# Patient Record
Sex: Female | Born: 1990 | Race: White | Hispanic: No | Marital: Single
Health system: Southern US, Community
[De-identification: ages and names within clinical notes are randomized; demographics above are authoritative.]

## PROBLEM LIST (undated history)

## (undated) DIAGNOSIS — Z87442 Personal history of urinary calculi: Secondary | ICD-10-CM

## (undated) DIAGNOSIS — T7840XA Allergy, unspecified, initial encounter: Secondary | ICD-10-CM

## (undated) DIAGNOSIS — M940 Chondrocostal junction syndrome [Tietze]: Secondary | ICD-10-CM

## (undated) HISTORY — DX: Allergy, unspecified, initial encounter: T78.40XA

## (undated) HISTORY — PX: CARPAL TUNNEL RELEASE: SHX101

## (undated) HISTORY — PX: ROTATOR CUFF REPAIR: SHX139

## (undated) HISTORY — PX: WISDOM TOOTH EXTRACTION: SHX21

## (undated) HISTORY — PX: KNEE SURGERY: SHX244

## (undated) HISTORY — PX: CHOLECYSTECTOMY: SHX55

## (undated) HISTORY — PX: APPENDECTOMY: SHX54

## (undated) HISTORY — DX: Chondrocostal junction syndrome (tietze): M94.0

---
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
## 2008-11-23 IMAGING — CT CT ANGIO CHEST
2 of 4 series · 19 of 35 positions shown · IV contrast ([ID] OMNI 300)
Comparison: 03/12/2007 plain film.

CLINICAL DATA: Right-sided chest pain and pleuritic pain. Question pulmonary
embolism. Dyspnea starting 2 days ago status post upper cholecystectomy. Smoker.

CT ANGIOGRAPHY OF CHEST - PULMONARY EMBOLISM PROTOCOL
TECHNIQUE: Multidetector CT imaging of the chest was performed according to the
protocol for detection of pulmonary embolism during bolus injection of
intravenous contrast.  Coronal and sagittal plane CT angiographic image
reconstructions were also generated.
Contrast:  100 cc Omnipaque 300

[Series 6: thin recons · axial · 0.55mm/px · z∈[-230,-30]mm · 17 of 218 slices shown]
[im 9/218  lung]
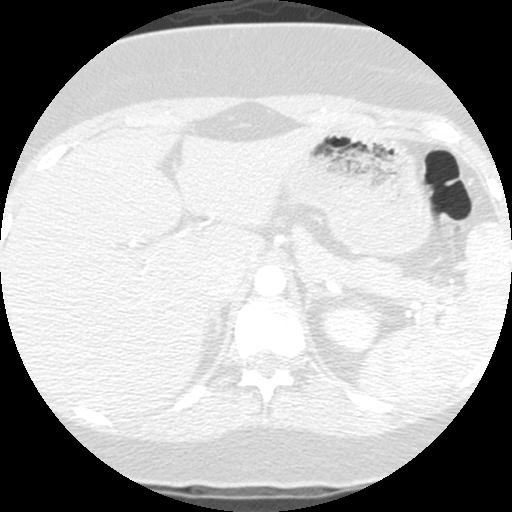
[im 26/218  mediastinal]
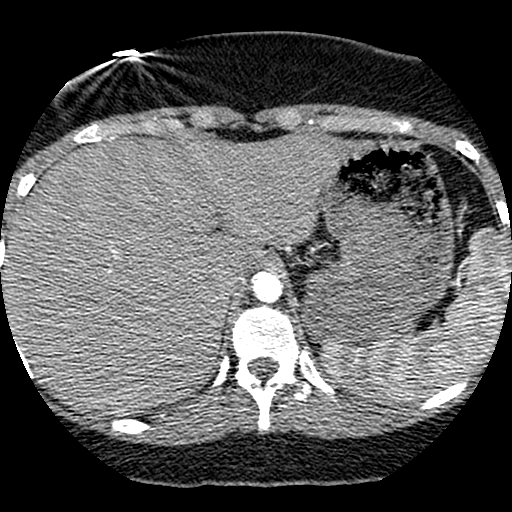
[im 34/218  lung]
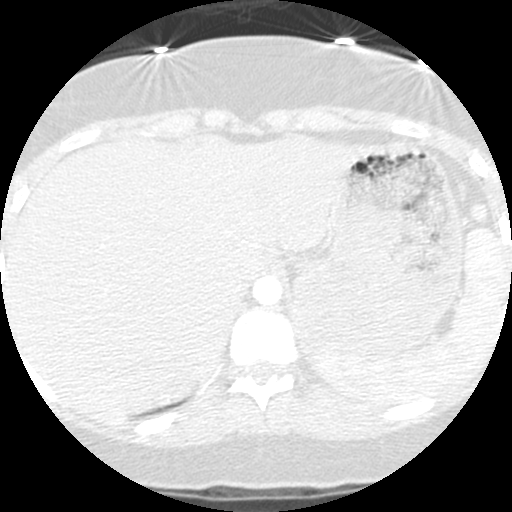
[im 51/218  mediastinal]
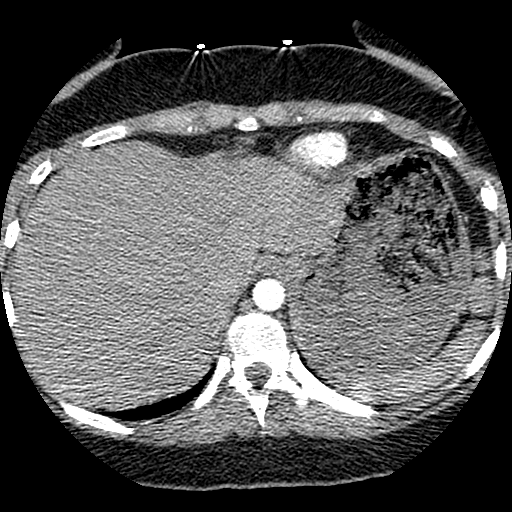
[im 59/218  lung]
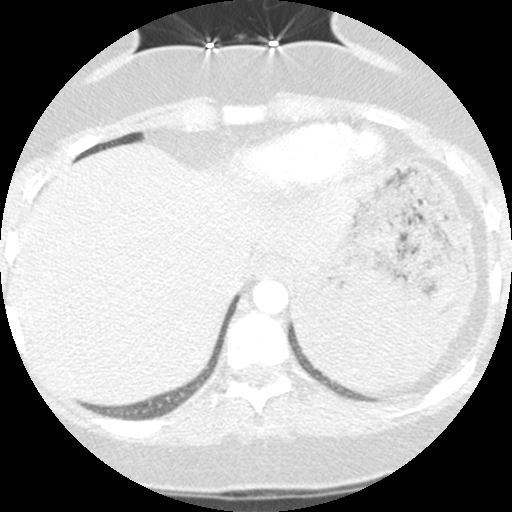
[im 76/218  mediastinal]
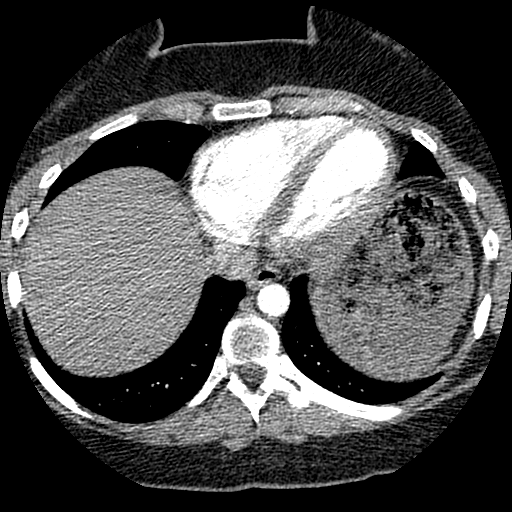
[im 84/218  lung]
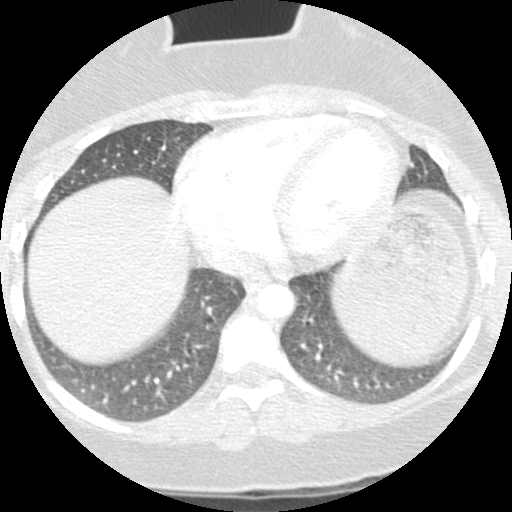
[im 101/218  mediastinal]
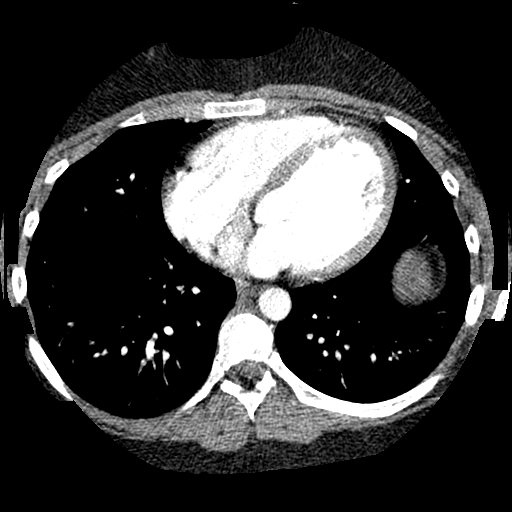
[im 117/218  lung]
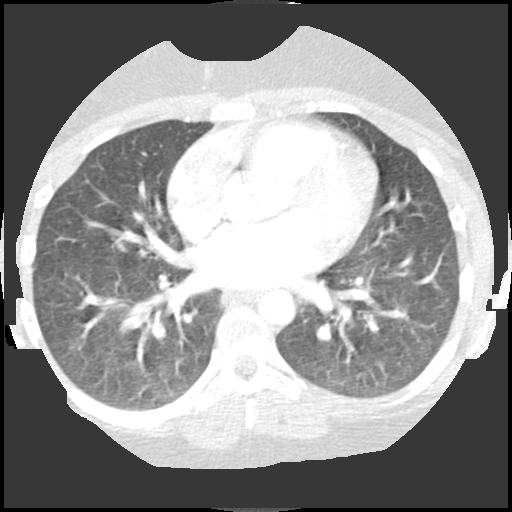
[im 124/218  mediastinal]
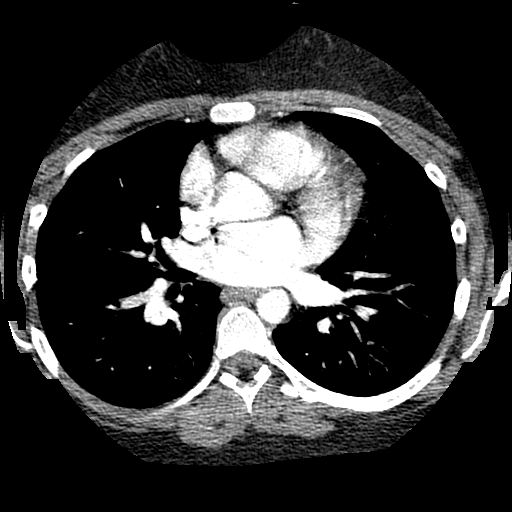
[im 134/218  lung]
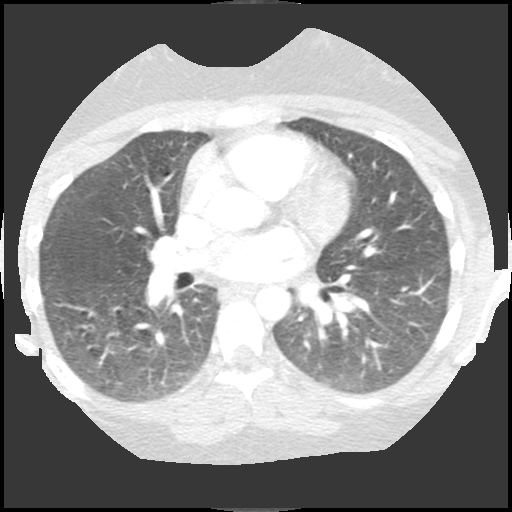
[im 142/218  mediastinal]
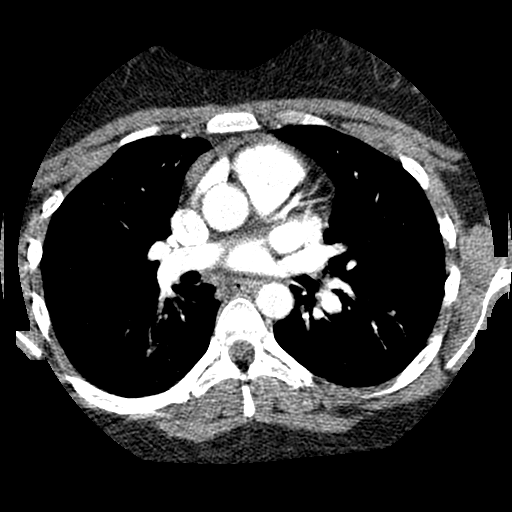
[im 159/218  lung]
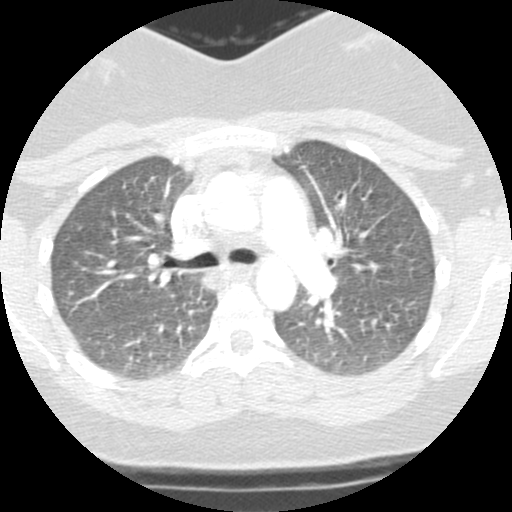
[im 167/218  mediastinal]
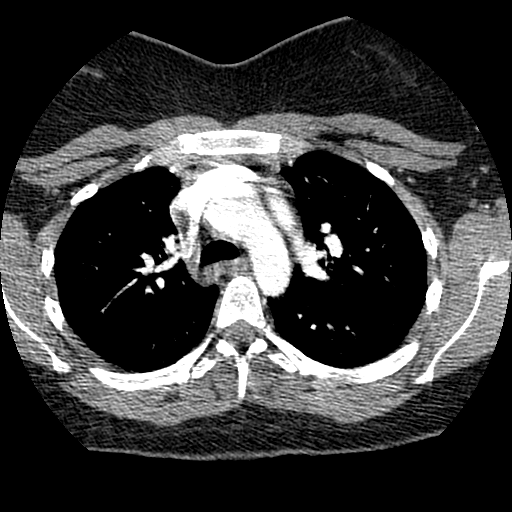
[im 184/218  lung]
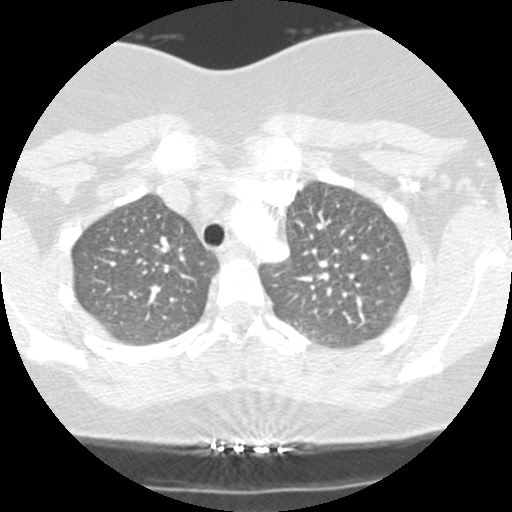
[im 192/218  mediastinal]
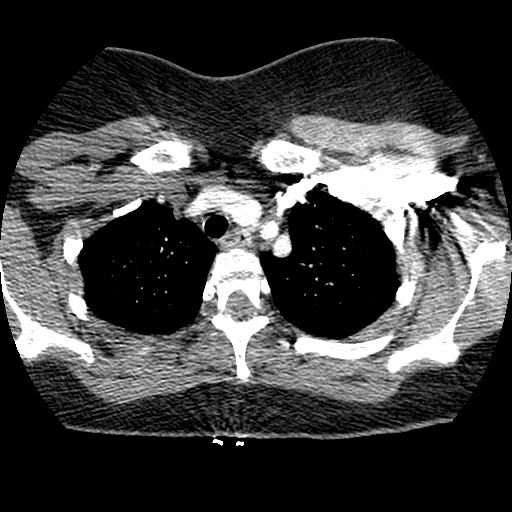
[im 209/218  lung]
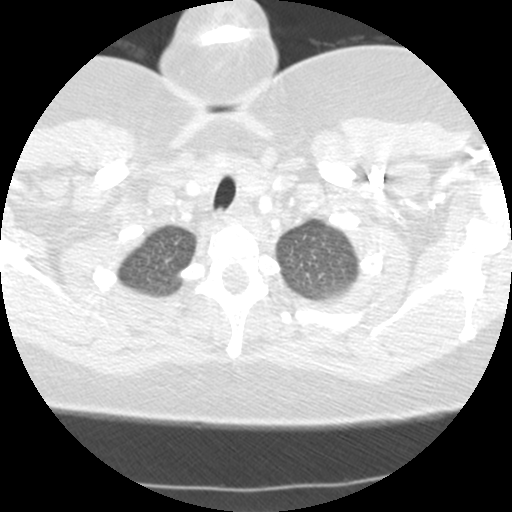

[Series 601: sag chest · sagittal · 0.55mm/px · 2 of 127 slices shown]
[im 43/127  mediastinal]
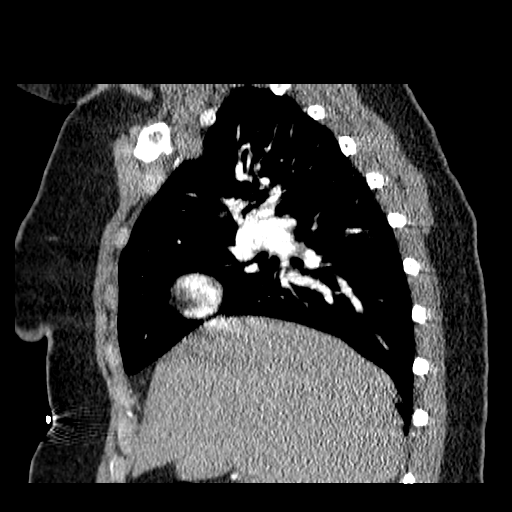
[im 85/127  mediastinal]
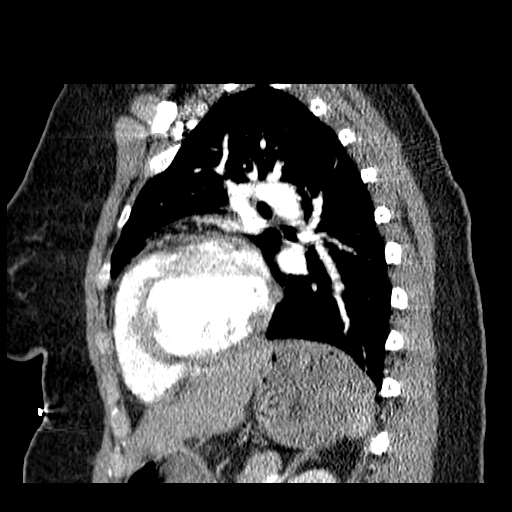

[19 of 35 positions shown; findings below may reference images not displayed]

FINDINGS: Lung windows demonstrate moderate limitation due to patient's size.
Increased density throughout both lungs likely related to poor inspiratory
effort and patient size. No nodules. No airspace opacities. Mild motion artifact
involves the lung bases.
Soft tissue windows:  The quality of this exam for evaluation of pulmonary
embolism is poor-moderate. The primary limitations are patient's size and motion
artifact. No central pulmonary embolism. No lobar pulmonary embolism. No large
segmental embolism to the lower lobes. .

Mild cardiomegaly without pericardial or pleural effusion. No mediastinal or
hilar adenopathy. Anterior mediastinal soft tissue on image 26 of series 4 is
likely residual thymus.
Limited abdominal imaging demonstrates cholecystectomy.

Normal bones.

IMPRESSION

1. Limited exam due to patient's size and motion artifact.
2. No evidence of central or lobar pulmonary embolism.
3. Cardiomegaly.
4. Increased density throughout both lungs felt to be related to poor
inspiratory effort and patient size.

## 2010-07-18 NOTE — Op Note (Signed)
NAMEMARSHE, SHRESTHA               ACCOUNT NO.:  [id number]   MEDICAL RECORD NO.:  [id number]          PATIENT TYPE:  OIB   LOCATION:  6123                         FACILITY:  [facility]   PHYSICIAN:  Prabhakar D. Pendse, M.D.DATE OF BIRTH:  02/05/1991   DATE OF PROCEDURE:  03/31/2005  DATE OF DISCHARGE:                                 OPERATIVE REPORT   PREOPERATIVE DIAGNOSES:  1.  Abdominal pain, possible appendicitis.  2.  Moderate obesity.   POSTOPERATIVE DIAGNOSES:  1.  Abdominal pain, possible appendicitis.  2.  Moderate obesity.   OPERATION:  Laparoscopy, appendectomy and cauterization of the appendiceal  mesenteric bleeder.   SURGEON:  Prabhakar D. Levie Heritage, M.D.   ASSISTANT:  Leonia Corona, M.D.   ANESTHESIA:  Nurse   OPERATIVE INDICATIONS:  This 20 year old somewhat obese girl was by Dr. Ermalinda Barrios with the history of persistent the right lower quadrant pain  associated with the anorexia without any other GI symptoms.  Initially  evaluation did not show any definitive diagnosis.  CBC, urinalysis, and  ultrasound of the abdomen were unremarkable.  Symptomatic treatment  initiated, however, there was no improvement, and CT scan with oral and IV  contrast was done which showed essentially normal gallbladder, kidneys,  uterus, small insignificant ovarian cyst and presence of appendicolith  without any signs of acute appendicitis.  Hence optimal supportive therapy  in the form of local heat, analgesics and liquid diet were prescribed.  However, right lower quadrant pain and tenderness continued and somewhat  worsened, hence laparoscopic appendectomy was planned.   At the time of surgery prior to laparoscopic evaluation, urinalysis sample  was collected for urine culture from the Foley catheter.  There was a small  amount of whitish discharged in the posterior vaginal vault.  Appropriate  cultures were taken for future reference.  Examination of the abdominal  cavity  by laparoscope showed there was no evidence of free fluid.  The  uterus and ovaries were unremarkable.  The tip of the appendix showed  congested vessels, however, there were no gross findings suggestive of acute  obstructive appendicitis.  The presence of the appendicolith and congested  tip of the appendix may show was some pathological signs which we are  awaiting.   OPERATIVE PROCEDURE:  Under satisfactory general endotracheal anesthesia,  the patient in supine position, initially a Foley catheter was introduced  and appropriate cultures were taken from the vaginal discharge.  Abdomen  thoroughly prepped and draped in the usual manner.  About 2 cm long vertical  infraumbilical incision was made, carried through the thick abdominal wall.  The rectus fascia was incised and the incision carried through the layers of  the abdominal wall and peritoneal cavity entered.  Hassan 10 mm port was  placed in, and the telescope was inserted.  Gas was connected.  Initial  examination of the right upper quadrant area showed no pathological lesions.  Examination of the pelvis showed no evidence of free fluid.  All the  peritoneal surfaces appeared smooth and shiny without any pathological  changes.  The uterus and ovaries appeared unremarkable.  Photographs were  taken.  At this time, two 5 mm ports were placed, one in the right upper  quadrant area and one in the left lower quadrant area.  The dissectors and  graspers were passed, and further exploration showed that the appendix  appeared to be about 3 inches long with normal smooth- appearing serosa  except at the tip there were congested blood vessels.  No gross obstructive  changes were noted.  In view of the CT scan findings of appendicolith, it  was decided to perform appendectomy.  Accordingly blunt dissection was  carried out to delineate the mesentery of the appendix by means of a  vascular stapler.  Initially the appendix was resected  following which the  mesentery now was resected.  There was still firm persistent bleeding from  the mesentery in the form of small artery.  However, this dissection was  done with a 30 degrees angle 5 mm telescope placed from the left lower  quadrant area and the dissecting instruments from the umbilical port.  The  area was irrigated with copious amounts of saline.  There still was no  improvement in the bleeding.  Several small staples were applied.  However,  there were unsuccessful in controlling the bleeding.  Hence, finally it was  decided to cauterize this area which was done through the umbilical port.  Satisfactory resection of the bleeding was noted.  The area was irrigated  again.  No further bleeding was visualized and it was decided to remove all  the scopes and instruments and close the abdominal cavity.  Accordingly,  under vision all the ports were removed.  There was no secondary bleeding  from the port sites was noted.  The previously placed pursestring suture in  the umbilical opening was closed and the subcutaneous tissue was  approximated with 3-0 Vicryl.  Skin closed with 4-0 Monocryl subcuticular  sutures.  The right upper quadrant and left lower quadrant ports were also  closed with Monocryl.  Appropriate Steri-Strips and dressings applied.  Throughout the procedure the patient's vital signs remained stable.  The  patient withstood the procedure well and was transferred to the recovery  room in satisfactory general condition.           ______________________________  Hyman Bible Levie Heritage, M.D.     PDP/MEDQ  D:  03/31/2005  T:  04/01/2005  Job:  [id number]

## 2010-07-18 NOTE — Discharge Summary (Signed)
NAMECONCHETTA, Jade               ACCOUNT NO.:  [id number]   MEDICAL RECORD NO.:  [id number]          PATIENT TYPE:  INP   LOCATION:  6123                         FACILITY:  [facility]   PHYSICIAN:  Prabhakar D. Pendse, M.D.DATE OF BIRTH:  09-15-1990   DATE OF ADMISSION:  03/31/2005  DATE OF DISCHARGE:  04/02/2005                                 DISCHARGE SUMMARY   HOSPITAL COURSE:  20 year old female admitted following laparoscopic  appendectomy.  The patient began having pain about a week ago which  progressed to the right lower quadrant.  CT and ultrasound were negative per  mother's report.  Consulted with Dr. Levie Heritage and decided to have elective  appendectomy.  Tolerated he surgery well, was on ampicillin and gentamicin  for 24 hours following surgery.  Vaginal culture was negative.  Urine  culture positive for gram negative rods, likely E. coli.  Started Cipro XR  for UTI.  Pain controlled with morphine initially then switched to Tylenol  with codeine.  Advanced feeds to liquids and tolerated it well.  Psych  evaluation for tension in the home but she was already seeing a counselor.   OPERATIONS AND PROCEDURES:  January 30 had a laparoscopic appendectomy.   SIGNIFICANT FINDINGS:  Urine culture positive for gram negative rods.   DISCHARGE DIAGNOSIS:  1.  Appendicitis status post appendectomy.  2.  Urinary tract infection.   DISCHARGE MEDICATIONS:  Cipro XR 500 mg p.o. daily for seven days, Tylenol  with codeine, 1-2 tabs p.o. q.4h. p.r.n. pain.   CONDITION ON DISCHARGE:  Stable.   DISCHARGE INSTRUCTIONS:  Follow up with Dr. Levie Heritage in two weeks on February  15 at 2 p.m.     ______________________________  Pediatrics Resident    ______________________________  Hyman Bible. Levie Heritage, M.D.    PR/MEDQ  D:  04/03/2005  T:  04/03/2005  Job:  [id number]

## 2010-07-18 NOTE — Op Note (Signed)
NAMEEBONYE, READE               ACCOUNT NO.:  [id number]   MEDICAL RECORD NO.:  [id number]          PATIENT TYPE:  AMB   LOCATION:  ENDO                         FACILITY:  [facility]   PHYSICIAN:  James L. Malon Kindle., M.D.DATE OF BIRTH:  1990/10/26   DATE OF PROCEDURE:  02/03/2006  DATE OF DISCHARGE:                               OPERATIVE REPORT   PROCEDURE:  Esophagogastroduodenoscopy and biopsy.   MEDICATIONS:  Cetacaine spray, fentanyl 100 mcg, Versed 10 mg IV.   INDICATIONS:  A 20 year old with persistent abdominal pain of unclear  cause.  At this point, she has had a CT scan, ultrasound, hepatobiliary  scan, all failing to reveal any obvious cause of her pain and failed to  respond to Donnatal and __________ medications.  This is done to look  for a cause of her pain.   DESCRIPTION OF PROCEDURE:  The procedure had been explained to the  patient's mother and consent obtained.  In the left lateral decubitus  position the Olympus scope was inserted and advanced.  The scope was  withdrawn after the duodenum had been reached.  The duodenum was  biopsied to look for evidence of celiac disease.  The duodenum was free  of ulcerations or erosions.  The scope was withdrawn back into the  stomach.  The pyloric channel was normal.  The fundus and cardia were  seen while in the retroflexed view and were normal.  The scope was  withdrawn.  There was minimal hiatal hernia.  No signs of Barrett  esophagus, ulceration, or irritation to the esophagus.  The scope was  withdrawn.  The patient tolerated the procedure well.   ASSESSMENT:  Right upper quadrant pain with essentially negative  endoscopy with no obvious cause.  Biopsies for celiac disease were  obtained.  I will continue the patient on her current medications, give  her a trial of Darvocet, and follow up in the office in 1-2 weeks.           ______________________________  Llana Aliment Malon Kindle., M.D.     Waldron Session  D:   02/03/2006  T:  02/03/2006  Job:  [id number]

## 2010-11-20 LAB — COMPREHENSIVE METABOLIC PANEL
ALT: 68 — ABNORMAL HIGH
Albumin: 3.8
Alkaline Phosphatase: 74
CO2: 24
Calcium: 9.6
Chloride: 106
GFR calc Af Amer: >60
Potassium: 3.7
Sodium: 138
Total Bilirubin: 0.7

## 2010-11-20 LAB — DIFFERENTIAL
Basophils Absolute: 0
Basophils Relative: 0
Monocytes Absolute: 0.5
Monocytes Relative: 5
Neutrophils Relative %: 68

## 2010-11-20 LAB — URINALYSIS, ROUTINE W REFLEX MICROSCOPIC
Bilirubin Urine: NEGATIVE
Ketones, ur: NEGATIVE
Nitrite: NEGATIVE
Specific Gravity, Urine: 1.023

## 2010-11-20 LAB — CBC
Hemoglobin: 13
MCHC: 35.1
Platelets: 272
RBC: 4.28
RDW: 12.9

## 2014-03-13 ENCOUNTER — Other Ambulatory Visit: Payer: Self-pay | Admitting: Obstetrics and Gynecology

## 2014-03-14 LAB — CYTOLOGY - PAP

## 2015-04-17 ENCOUNTER — Ambulatory Visit (INDEPENDENT_AMBULATORY_CARE_PROVIDER_SITE_OTHER): Payer: Self-pay | Admitting: Physician Assistant

## 2015-04-17 ENCOUNTER — Ambulatory Visit (INDEPENDENT_AMBULATORY_CARE_PROVIDER_SITE_OTHER): Payer: Self-pay

## 2015-04-17 VITALS — BP 130/80 | HR 93 | Temp 98.9°F | Resp 17

## 2015-04-17 DIAGNOSIS — S93401A Sprain of unspecified ligament of right ankle, initial encounter: Secondary | ICD-10-CM

## 2015-04-17 DIAGNOSIS — M25571 Pain in right ankle and joints of right foot: Secondary | ICD-10-CM

## 2015-04-17 MED ORDER — IBUPROFEN 200 MG PO TABS
600.0000 mg | ORAL_TABLET | Freq: Once | ORAL | Status: AC
  Administered 2015-04-17: 600 mg via ORAL

## 2015-04-17 NOTE — Patient Instructions (Addendum)
Because you received an x-ray today, you will receive an invoice from [facility]. Please contact [facility] at [phone] with questions or concerns regarding your invoice. Our billing staff will not be able to assist you with those questions.   Ibuprofen/tylenol around the clock Rest, apply ice, apply compression with ace bandage. Ambulate with crutches. In 1 week, graduate to supportive shoe for ambulation. If unable to bear weight at that time, return for further eval/xrays.

## 2015-04-17 NOTE — Progress Notes (Signed)
[facility] [address] [place] [address] [phone]  Date:  04/17/2015   Name:  Jade Raymond   DOB:  Jan 03, 1991   MRN:  [id number]  PCP:  No primary care provider on file.    Chief Complaint: Ankle Pain   History of Present Illness:  This is a 25 y.o. female who is presenting with right ankle pain after falling down stairs 1 hour ago. She lost her footing and ankle inverted. She states she heard a snap. Lateral ankle started swelling immediately. She is unable to bear weight on that right foot. She denies paresthesias. States very painful to move foot in any direction - sends shooting pain around ankle and up leg. She has not tried anything for pain yet. She has never had an ankle injury before. She has had knee surgery in the past and has crutches at home.  Review of Systems:  Review of Systems See HPI  There are no active problems to display for this patient.   Prior to Admission medications   Medication Sig Start Date End Date Taking? Authorizing Provider  levonorgestrel-ethinyl estradiol (NORDETTE) 0.15-30 MG-MCG tablet Take 1 tablet by mouth daily.   Yes Historical Provider, MD    No Known Allergies  History reviewed. No pertinent past surgical history.  Social History  Substance Use Topics  . Smoking status: Never Smoker   . Smokeless tobacco: None  . Alcohol Use: None    History reviewed. No pertinent family history.  Medication list has been reviewed and updated.  Physical Examination:  Physical Exam  Constitutional: She is oriented to person, place, and time. She appears well-developed and well-nourished. No distress.  HENT:  Head: Normocephalic and atraumatic.  Right Ear: Hearing normal.  Left Ear: Hearing normal.  Nose: Nose normal.  Eyes: Conjunctivae and lids are normal. Right eye exhibits no discharge. Left eye exhibits no discharge. No scleral icterus.  Cardiovascular: Normal rate, regular rhythm and normal pulses.    Pulmonary/Chest: Effort normal. No respiratory distress.  Musculoskeletal:       Right ankle: She exhibits decreased range of motion (all directions due to pain), swelling (over lateral malleolus) and ecchymosis. She exhibits no deformity and normal pulse. Tenderness. Lateral malleolus tenderness found. No medial malleolus, no head of 5th metatarsal and no proximal fibula tenderness found. Achilles tendon normal.       Left ankle: Normal.       Right foot: There is tenderness (proximal anterior foot). There is normal range of motion.       Left foot: Normal.  Neurological: She is alert and oriented to person, place, and time. She has normal strength. No sensory deficit. Gait (unable to bear weight on right foot) abnormal.  Skin: Skin is warm, dry and intact. No lesion and no rash noted.  Psychiatric: She has a normal mood and affect. Her speech is normal and behavior is normal. Thought content normal.   BP 130/80 mmHg  Pulse 93  Temp(Src) 98.9 F (37.2 C) (Oral)  Resp 17  SpO2 97%  LMP 04/09/2015  Dg Ankle Complete Right  04/17/2015  CLINICAL DATA:  Right ankle pain.  Fall on stairs this morning. EXAM: RIGHT ANKLE - COMPLETE 3+ VIEW COMPARISON:  None. FINDINGS: Lateral soft tissue swelling. No fracture, subluxation or dislocation. Small plantar calcaneal spur. IMPRESSION: No acute bony abnormality. Electronically Signed   By: Rolm Baptise M.D.   On: 04/17/2015 10:55   Dg Foot Complete Right  04/17/2015  CLINICAL DATA:  RIGHT foot injury.  Ankle pain EXAM: RIGHT FOOT COMPLETE - 3+ VIEW COMPARISON:  None. FINDINGS: No fracture or dislocation of mid foot or forefoot. The phalanges are normal. The calcaneus is normal. No soft tissue abnormality. IMPRESSION: No acute osseous abnormality. Electronically Signed   By: Suzy Bouchard M.D.   On: 04/17/2015 11:02   Assessment and Plan:  1. Sprain of ankle, right, initial encounter 2. Ankle pain, right Radiographs negative. Suspect lateral ankle  sprain. Ace wrap applied. She has crutches at home she will use. Counseled on RICE. No weight bearing for 1 week. After 1 week may graduate to supportive shoe for ambulation. If unable to bear weight in 1 week, return for eval and repeat xrays. - DG Ankle Complete Right; Future - DG Foot Complete Right; Future - ibuprofen (ADVIL,MOTRIN) tablet 600 mg; Take 3 tablets (600 mg total) by mouth once.   Benjaman Pott Drenda Freeze, MHS [facility]  04/17/2015

## 2016-01-17 LAB — OB RESULTS CONSOLE RUBELLA ANTIBODY, IGM: RUBELLA: IMMUNE

## 2016-01-17 LAB — OB RESULTS CONSOLE ABO/RH

## 2016-01-17 LAB — OB RESULTS CONSOLE RPR: RPR: NONREACTIVE

## 2016-01-17 LAB — OB RESULTS CONSOLE HIV ANTIBODY (ROUTINE TESTING): HIV: NONREACTIVE

## 2016-02-04 LAB — OB RESULTS CONSOLE GC/CHLAMYDIA: GC PROBE AMP, GENITAL: NEGATIVE

## 2016-08-20 ENCOUNTER — Encounter (HOSPITAL_COMMUNITY): Payer: Self-pay

## 2016-08-20 DIAGNOSIS — N898 Other specified noninflammatory disorders of vagina: Secondary | ICD-10-CM | POA: Diagnosis present

## 2016-08-20 DIAGNOSIS — O471 False labor at or after 37 completed weeks of gestation: Secondary | ICD-10-CM | POA: Insufficient documentation

## 2016-08-20 DIAGNOSIS — Z3A40 40 weeks gestation of pregnancy: Secondary | ICD-10-CM | POA: Diagnosis not present

## 2016-08-20 DIAGNOSIS — Z3A39 39 weeks gestation of pregnancy: Secondary | ICD-10-CM | POA: Insufficient documentation

## 2016-08-20 LAB — WET PREP, GENITAL
CLUE CELLS WET PREP: NONE SEEN
TRICH WET PREP: NONE SEEN
YEAST WET PREP: NONE SEEN

## 2016-08-20 LAB — OB RESULTS CONSOLE GBS: STREP GROUP B AG: NEGATIVE

## 2016-08-22 ENCOUNTER — Encounter (HOSPITAL_COMMUNITY): Payer: Self-pay | Admitting: *Deleted

## 2016-08-22 DIAGNOSIS — O324XX0 Maternal care for high head at term, not applicable or unspecified: Secondary | ICD-10-CM | POA: Diagnosis present

## 2016-08-22 DIAGNOSIS — Z3A40 40 weeks gestation of pregnancy: Secondary | ICD-10-CM

## 2016-08-22 DIAGNOSIS — Z3689 Encounter for other specified antenatal screening: Secondary | ICD-10-CM

## 2016-08-22 DIAGNOSIS — O163 Unspecified maternal hypertension, third trimester: Secondary | ICD-10-CM

## 2016-08-22 DIAGNOSIS — Z98891 History of uterine scar from previous surgery: Secondary | ICD-10-CM

## 2016-08-22 DIAGNOSIS — Z3493 Encounter for supervision of normal pregnancy, unspecified, third trimester: Secondary | ICD-10-CM | POA: Diagnosis present

## 2016-08-22 LAB — CBC
HEMATOCRIT: 31.2 % — AB (ref 36.0–46.0)
HEMOGLOBIN: 11.2 g/dL — AB (ref 12.0–15.0)
HEMOGLOBIN: 11.9 g/dL — AB (ref 12.0–15.0)
MCH: 33.8 pg (ref 26.0–34.0)
MCH: 33.2 pg (ref 26.0–34.0)
MCHC: 35.1 g/dL (ref 30.0–36.0)
MCV: 94.3 fL (ref 78.0–100.0)
MCV: 94.7 fL (ref 78.0–100.0)
PLATELETS: 171 10*3/uL (ref 150–400)
PLATELETS: 184 10*3/uL (ref 150–400)
RBC: 3.31 MIL/uL — AB (ref 3.87–5.11)

## 2016-08-22 LAB — URINALYSIS, ROUTINE W REFLEX MICROSCOPIC: GLUCOSE, UA: NEGATIVE mg/dL

## 2016-08-22 LAB — ABO/RH: ABO/RH(D): O POS

## 2016-08-22 LAB — COMPREHENSIVE METABOLIC PANEL
ALK PHOS: 124 U/L (ref 38–126)
ALT: 9 U/L — AB (ref 14–54)
BILIRUBIN TOTAL: 0.7 mg/dL (ref 0.3–1.2)
BUN: 8 mg/dL (ref 6–20)
CALCIUM: 8.9 mg/dL (ref 8.9–10.3)

## 2016-08-22 MED ADMIN — Lactated Ringer's Solution: INTRAVENOUS | @ 16:00:00 | NDC 00338011704

## 2016-08-23 ENCOUNTER — Encounter (HOSPITAL_COMMUNITY): Payer: Self-pay | Admitting: *Deleted

## 2016-08-23 DIAGNOSIS — Z98891 History of uterine scar from previous surgery: Secondary | ICD-10-CM

## 2016-08-23 MED ADMIN — Lactated Ringer's Solution: INTRAVENOUS | @ 05:00:00 | NDC 00338011704

## 2016-08-24 LAB — CBC
HEMATOCRIT: 27.5 % — AB (ref 36.0–46.0)
HEMOGLOBIN: 9.6 g/dL — AB (ref 12.0–15.0)
MCV: 97.5 fL (ref 78.0–100.0)
RBC: 2.82 MIL/uL — AB (ref 3.87–5.11)

## 2017-06-14 LAB — OB RESULTS CONSOLE GC/CHLAMYDIA: CHLAMYDIA, DNA PROBE: NEGATIVE

## 2017-06-14 LAB — OB RESULTS CONSOLE RPR: RPR: NONREACTIVE

## 2017-11-15 ENCOUNTER — Encounter (HOSPITAL_COMMUNITY): Payer: Self-pay | Admitting: *Deleted

## 2017-11-24 ENCOUNTER — Encounter (HOSPITAL_COMMUNITY): Payer: Self-pay

## 2017-11-26 HISTORY — DX: Personal history of urinary calculi: Z87.442

## 2017-11-26 LAB — CBC
HEMOGLOBIN: 11 g/dL — AB (ref 12.0–15.0)
RBC: 3.44 MIL/uL — AB (ref 3.87–5.11)
WBC: 7.1 10*3/uL (ref 4.0–10.5)

## 2017-11-26 LAB — TYPE AND SCREEN
ABO/RH(D): O POS
ANTIBODY SCREEN: NEGATIVE

## 2017-11-29 ENCOUNTER — Encounter (HOSPITAL_COMMUNITY): Payer: Self-pay | Admitting: *Deleted

## 2017-11-29 DIAGNOSIS — O34211 Maternal care for low transverse scar from previous cesarean delivery: Secondary | ICD-10-CM | POA: Diagnosis present

## 2017-11-29 DIAGNOSIS — Z3A39 39 weeks gestation of pregnancy: Secondary | ICD-10-CM

## 2017-11-29 MED ADMIN — Lactated Ringer's Solution: INTRAVENOUS | @ 08:00:00 | NDC 00338011704

## 2017-11-29 MED ADMIN — Lactated Ringer's Solution: INTRAVENOUS | @ 18:00:00 | NDC 00338011704

## 2017-11-29 MED ADMIN — Lactated Ringer's Solution: INTRAVENOUS | @ 07:00:00 | NDC 00338011704

## 2017-11-29 MED ADMIN — Lactated Ringer's Solution: INTRAVENOUS | @ 09:00:00 | NDC 00338011704

## 2017-11-30 LAB — CBC
HEMOGLOBIN: 9.1 g/dL — AB (ref 12.0–15.0)
PLATELETS: 177 10*3/uL (ref 150–400)
RBC: 2.74 MIL/uL — AB (ref 3.87–5.11)
WBC: 8.9 10*3/uL (ref 4.0–10.5)

## 2017-11-30 LAB — BIRTH TISSUE RECOVERY COLLECTION (PLACENTA DONATION)

## 2017-11-30 MED ADMIN — Lactated Ringer's Solution: INTRAVENOUS | @ 03:00:00 | NDC 00338011704

## 2019-03-15 DIAGNOSIS — M25511 Pain in right shoulder: Secondary | ICD-10-CM | POA: Diagnosis not present

## 2019-03-29 ENCOUNTER — Encounter (HOSPITAL_COMMUNITY): Payer: Self-pay | Admitting: Emergency Medicine

## 2019-03-29 ENCOUNTER — Other Ambulatory Visit: Payer: Self-pay

## 2019-03-29 ENCOUNTER — Emergency Department (HOSPITAL_COMMUNITY)
Admission: EM | Admit: 2019-03-29 | Discharge: 2019-03-29 | Disposition: A | Discharge status: Home / Self Care | Payer: BC Managed Care – PPO | Attending: Emergency Medicine | Admitting: Emergency Medicine

## 2019-03-29 DIAGNOSIS — R519 Headache, unspecified: Secondary | ICD-10-CM | POA: Diagnosis not present

## 2019-03-29 DIAGNOSIS — Y999 Unspecified external cause status: Secondary | ICD-10-CM | POA: Diagnosis not present

## 2019-03-29 DIAGNOSIS — S0990XA Unspecified injury of head, initial encounter: Secondary | ICD-10-CM | POA: Insufficient documentation

## 2019-03-29 DIAGNOSIS — Y9301 Activity, walking, marching and hiking: Secondary | ICD-10-CM | POA: Diagnosis not present

## 2019-03-29 DIAGNOSIS — Y929 Unspecified place or not applicable: Secondary | ICD-10-CM | POA: Diagnosis not present

## 2019-03-29 DIAGNOSIS — W2201XA Walked into wall, initial encounter: Secondary | ICD-10-CM | POA: Insufficient documentation

## 2019-03-29 MED ORDER — DEXAMETHASONE SODIUM PHOSPHATE 10 MG/ML IJ SOLN
10.0000 mg | Freq: Once | INTRAMUSCULAR | Status: AC
  Administered 2019-03-29: 10 mg via INTRAMUSCULAR
  Filled 2019-03-29: qty 1

## 2019-03-29 MED ORDER — KETOROLAC TROMETHAMINE 60 MG/2ML IM SOLN
15.0000 mg | Freq: Once | INTRAMUSCULAR | Status: AC
  Administered 2019-03-29: 15 mg via INTRAMUSCULAR
  Filled 2019-03-29: qty 2

## 2019-03-29 NOTE — ED Provider Notes (Signed)
[facility] DEPT Provider Note   CSN: [id number] Arrival date & time: 03/29/19  1238     History Chief Complaint  Patient presents with  . Head Injury    Jade Raymond is a 29 y.o. female.  29yo female with no significant past medical history presents with complaint of headache. Patient states she was passing through a low doorway yesterday and hit her head on the top of the door frame. Patient felt a little light headed at the time. Denies nausea, wounds, visual disturbance. Patient reports ongoing pain in her head, took Tylenol without relief, last had Tylenol at 7AM. History of migraines, does not feel similar. Also reports a sensation as if someone is pouring cold water over her head. Headache was worse after working on a computer, denies light sensitivity.         History reviewed. No pertinent past medical history.  There are no problems to display for this patient.   History reviewed. No pertinent surgical history.   OB History   No obstetric history on file.     No family history on file.  Social History   Tobacco Use  . Smoking status: Not on file  Substance Use Topics  . Alcohol use: Not on file  . Drug use: Not on file    Home Medications Prior to Admission medications   Not on File    Allergies    Patient has no allergy information on record.  Review of Systems   Review of Systems  Constitutional: Negative for chills and fever.  Eyes: Negative for visual disturbance.  Gastrointestinal: Negative for nausea and vomiting.  Musculoskeletal: Negative for neck pain and neck stiffness.  Skin: Negative for rash and wound.  Allergic/Immunologic: Negative for immunocompromised state.  Neurological: Positive for headaches. Negative for weakness and numbness.  Hematological: Does not bruise/bleed easily.  Psychiatric/Behavioral: Negative for confusion.  All other systems reviewed and are negative.   Physical  Exam Updated Vital Signs BP 122/86 (BP Location: Right Arm)   Pulse 63   Temp 98.1 F (36.7 C) (Oral)   Resp 18   SpO2 100%   Physical Exam Vitals and nursing note reviewed.  Constitutional:      General: She is not in acute distress.    Appearance: She is well-developed. She is not diaphoretic.  HENT:     Head: Normocephalic and atraumatic.      Nose: Nose normal.     Mouth/Throat:     Mouth: Mucous membranes are moist.  Eyes:     Extraocular Movements: Extraocular movements intact.     Pupils: Pupils are equal, round, and reactive to light.  Cardiovascular:     Rate and Rhythm: Normal rate and regular rhythm.     Pulses: Normal pulses.     Heart sounds: Normal heart sounds.  Pulmonary:     Effort: Pulmonary effort is normal.     Breath sounds: Normal breath sounds.  Musculoskeletal:        General: No swelling, tenderness, deformity or signs of injury.     Cervical back: Normal range of motion and neck supple. No tenderness. No spinous process tenderness or muscular tenderness. Normal range of motion.  Lymphadenopathy:     Cervical: No cervical adenopathy.  Skin:    General: Skin is warm and dry.     Findings: No erythema or rash.  Neurological:     Mental Status: She is alert and oriented to person, place, and time.  GCS: GCS eye subscore is 4. GCS verbal subscore is 5. GCS motor subscore is 6.     Cranial Nerves: Cranial nerves are intact. No facial asymmetry.     Sensory: Sensation is intact.     Motor: Motor function is intact. No weakness.     Gait: Gait is intact. Gait and tandem walk normal.     Deep Tendon Reflexes:     Reflex Scores:      Tricep reflexes are 2+ on the right side and 2+ on the left side.      Bicep reflexes are 2+ on the right side and 2+ on the left side.      Brachioradialis reflexes are 2+ on the right side and 2+ on the left side.      Patellar reflexes are 2+ on the right side and 2+ on the left side. Psychiatric:        Behavior:  Behavior normal.     ED Results / Procedures / Treatments   Labs (all labs ordered are listed, but only abnormal results are displayed) Labs Reviewed - No data to display  EKG None  Radiology No results found.  Procedures Procedures (including critical care time)  Medications Ordered in ED Medications  ketorolac (TORADOL) injection 15 mg (15 mg Intramuscular Given 03/29/19 1425)  dexamethasone (DECADRON) injection 10 mg (10 mg Intramuscular Given 03/29/19 1425)    ED Course  I have reviewed the triage vital signs and the nursing notes.  Pertinent labs & imaging results that were available during my care of the patient were reviewed by me and considered in my medical decision making (see chart for details).  Clinical Course as of Mar 29 1439  Wed Mar 29, 2019  1439 29yo female with headache after hitting head yesterday. No LOC, no visual disturbance, no difficulty ambulating. No neck tenderness on exam with ROM or palpation. Patient will be given decadron and toradol for her headache, referred to concussion clinic. Advised to return to the ER for new or worsening symptoms.    [LM]    Clinical Course User Index [LM] Roque Lias   MDM Rules/Calculators/A&P                      Final Clinical Impression(s) / ED Diagnoses Final diagnoses:  Injury of head, initial encounter    Rx / DC Orders ED Discharge Orders    None       Tacy Learn, PA-C 03/29/19 1441    Sherwood Gambler, MD 04/01/19 [phone]

## 2019-03-29 NOTE — Discharge Instructions (Signed)
Limit time of computer screens, TV, cell phone.  Take Tylenol as needed as directed for headaches. Follow up with concussion clinic. Return to ER for any new or worsening symptoms.

## 2019-03-29 NOTE — ED Triage Notes (Signed)
Patient hit her head on a side attic yesterday. Said she has pain in her posterior skull today, took 2 Tylenol at 7am and did not help her pain. Denies LOC, blurred vision, or passing out. Ambulatory alert and oriented in triage. Stated she tried to go to UC but they would no check for a concussion.

## 2019-03-30 ENCOUNTER — Telehealth: Payer: Self-pay

## 2019-03-30 NOTE — Telephone Encounter (Signed)
Called patient about scheduling in concussion clinic. Patient declines appointment at this time.

## 2019-04-05 ENCOUNTER — Encounter (HOSPITAL_COMMUNITY): Payer: Self-pay | Admitting: *Deleted

## 2019-04-12 DIAGNOSIS — M25511 Pain in right shoulder: Secondary | ICD-10-CM | POA: Diagnosis not present

## 2019-04-18 DIAGNOSIS — M25511 Pain in right shoulder: Secondary | ICD-10-CM | POA: Diagnosis not present

## 2019-04-21 DIAGNOSIS — M25511 Pain in right shoulder: Secondary | ICD-10-CM | POA: Insufficient documentation

## 2019-04-24 DIAGNOSIS — M7541 Impingement syndrome of right shoulder: Secondary | ICD-10-CM | POA: Diagnosis not present

## 2019-04-24 DIAGNOSIS — M25511 Pain in right shoulder: Secondary | ICD-10-CM | POA: Diagnosis not present

## 2019-05-04 DIAGNOSIS — M25511 Pain in right shoulder: Secondary | ICD-10-CM | POA: Diagnosis not present

## 2019-05-09 DIAGNOSIS — M25511 Pain in right shoulder: Secondary | ICD-10-CM | POA: Diagnosis not present

## 2019-05-14 DIAGNOSIS — L03311 Cellulitis of abdominal wall: Secondary | ICD-10-CM | POA: Diagnosis not present

## 2019-05-15 DIAGNOSIS — M25511 Pain in right shoulder: Secondary | ICD-10-CM | POA: Diagnosis not present

## 2019-05-16 DIAGNOSIS — Q899 Congenital malformation, unspecified: Secondary | ICD-10-CM | POA: Diagnosis not present

## 2019-05-17 DIAGNOSIS — M25511 Pain in right shoulder: Secondary | ICD-10-CM | POA: Diagnosis not present

## 2019-05-19 DIAGNOSIS — Q899 Congenital malformation, unspecified: Secondary | ICD-10-CM | POA: Diagnosis not present

## 2019-05-22 DIAGNOSIS — Q899 Congenital malformation, unspecified: Secondary | ICD-10-CM | POA: Diagnosis not present

## 2019-05-23 DIAGNOSIS — M25511 Pain in right shoulder: Secondary | ICD-10-CM | POA: Diagnosis not present

## 2019-05-25 DIAGNOSIS — Q899 Congenital malformation, unspecified: Secondary | ICD-10-CM | POA: Diagnosis not present

## 2019-05-26 DIAGNOSIS — M25511 Pain in right shoulder: Secondary | ICD-10-CM | POA: Diagnosis not present

## 2019-06-02 DIAGNOSIS — M25511 Pain in right shoulder: Secondary | ICD-10-CM | POA: Diagnosis not present

## 2019-06-05 DIAGNOSIS — M7551 Bursitis of right shoulder: Secondary | ICD-10-CM | POA: Diagnosis not present

## 2019-06-05 DIAGNOSIS — M25511 Pain in right shoulder: Secondary | ICD-10-CM | POA: Diagnosis not present

## 2019-06-06 DIAGNOSIS — R198 Other specified symptoms and signs involving the digestive system and abdomen: Secondary | ICD-10-CM | POA: Diagnosis not present

## 2019-06-12 DIAGNOSIS — M25511 Pain in right shoulder: Secondary | ICD-10-CM | POA: Diagnosis not present

## 2019-06-15 DIAGNOSIS — M25511 Pain in right shoulder: Secondary | ICD-10-CM | POA: Diagnosis not present

## 2019-06-23 DIAGNOSIS — M25511 Pain in right shoulder: Secondary | ICD-10-CM | POA: Diagnosis not present

## 2019-06-26 DIAGNOSIS — M25511 Pain in right shoulder: Secondary | ICD-10-CM | POA: Diagnosis not present

## 2019-06-27 DIAGNOSIS — M25511 Pain in right shoulder: Secondary | ICD-10-CM | POA: Diagnosis not present

## 2019-07-03 DIAGNOSIS — M542 Cervicalgia: Secondary | ICD-10-CM | POA: Diagnosis not present

## 2019-07-03 DIAGNOSIS — M546 Pain in thoracic spine: Secondary | ICD-10-CM | POA: Diagnosis not present

## 2019-07-03 DIAGNOSIS — M25511 Pain in right shoulder: Secondary | ICD-10-CM | POA: Diagnosis not present

## 2019-07-14 DIAGNOSIS — M546 Pain in thoracic spine: Secondary | ICD-10-CM | POA: Insufficient documentation

## 2019-07-14 DIAGNOSIS — M542 Cervicalgia: Secondary | ICD-10-CM | POA: Diagnosis not present

## 2019-07-17 DIAGNOSIS — M25511 Pain in right shoulder: Secondary | ICD-10-CM | POA: Diagnosis not present

## 2019-07-19 DIAGNOSIS — M542 Cervicalgia: Secondary | ICD-10-CM | POA: Diagnosis not present

## 2019-07-19 DIAGNOSIS — M546 Pain in thoracic spine: Secondary | ICD-10-CM | POA: Diagnosis not present

## 2019-07-21 DIAGNOSIS — M542 Cervicalgia: Secondary | ICD-10-CM | POA: Diagnosis not present

## 2019-07-21 DIAGNOSIS — M25511 Pain in right shoulder: Secondary | ICD-10-CM | POA: Diagnosis not present

## 2019-07-21 DIAGNOSIS — M546 Pain in thoracic spine: Secondary | ICD-10-CM | POA: Diagnosis not present

## 2019-07-24 DIAGNOSIS — M542 Cervicalgia: Secondary | ICD-10-CM | POA: Diagnosis not present

## 2019-07-24 DIAGNOSIS — M546 Pain in thoracic spine: Secondary | ICD-10-CM | POA: Diagnosis not present

## 2019-07-27 DIAGNOSIS — M25511 Pain in right shoulder: Secondary | ICD-10-CM | POA: Diagnosis not present

## 2019-08-01 DIAGNOSIS — M5030 Other cervical disc degeneration, unspecified cervical region: Secondary | ICD-10-CM | POA: Insufficient documentation

## 2019-08-01 DIAGNOSIS — M25511 Pain in right shoulder: Secondary | ICD-10-CM | POA: Diagnosis not present

## 2019-08-01 DIAGNOSIS — M546 Pain in thoracic spine: Secondary | ICD-10-CM | POA: Diagnosis not present

## 2019-08-01 DIAGNOSIS — M542 Cervicalgia: Secondary | ICD-10-CM | POA: Diagnosis not present

## 2019-08-03 DIAGNOSIS — M542 Cervicalgia: Secondary | ICD-10-CM | POA: Diagnosis not present

## 2019-08-03 DIAGNOSIS — M25511 Pain in right shoulder: Secondary | ICD-10-CM | POA: Diagnosis not present

## 2019-08-03 DIAGNOSIS — M546 Pain in thoracic spine: Secondary | ICD-10-CM | POA: Diagnosis not present

## 2019-08-10 DIAGNOSIS — M542 Cervicalgia: Secondary | ICD-10-CM | POA: Diagnosis not present

## 2019-08-10 DIAGNOSIS — M546 Pain in thoracic spine: Secondary | ICD-10-CM | POA: Diagnosis not present

## 2019-08-14 DIAGNOSIS — M25511 Pain in right shoulder: Secondary | ICD-10-CM | POA: Diagnosis not present

## 2019-08-17 DIAGNOSIS — M25511 Pain in right shoulder: Secondary | ICD-10-CM | POA: Diagnosis not present

## 2019-08-22 DIAGNOSIS — M25511 Pain in right shoulder: Secondary | ICD-10-CM | POA: Diagnosis not present

## 2019-08-24 DIAGNOSIS — M25511 Pain in right shoulder: Secondary | ICD-10-CM | POA: Diagnosis not present

## 2019-08-29 DIAGNOSIS — M546 Pain in thoracic spine: Secondary | ICD-10-CM | POA: Diagnosis not present

## 2019-08-29 DIAGNOSIS — M5030 Other cervical disc degeneration, unspecified cervical region: Secondary | ICD-10-CM | POA: Diagnosis not present

## 2019-09-16 DIAGNOSIS — M5030 Other cervical disc degeneration, unspecified cervical region: Secondary | ICD-10-CM | POA: Diagnosis not present

## 2019-12-27 DIAGNOSIS — M542 Cervicalgia: Secondary | ICD-10-CM | POA: Diagnosis not present

## 2019-12-27 DIAGNOSIS — M546 Pain in thoracic spine: Secondary | ICD-10-CM | POA: Diagnosis not present

## 2020-01-09 DIAGNOSIS — M5030 Other cervical disc degeneration, unspecified cervical region: Secondary | ICD-10-CM | POA: Diagnosis not present

## 2020-01-10 DIAGNOSIS — L739 Follicular disorder, unspecified: Secondary | ICD-10-CM | POA: Diagnosis not present

## 2020-01-10 DIAGNOSIS — D225 Melanocytic nevi of trunk: Secondary | ICD-10-CM | POA: Diagnosis not present

## 2020-01-10 DIAGNOSIS — L719 Rosacea, unspecified: Secondary | ICD-10-CM | POA: Diagnosis not present

## 2020-01-10 DIAGNOSIS — D2271 Melanocytic nevi of right lower limb, including hip: Secondary | ICD-10-CM | POA: Diagnosis not present

## 2020-01-30 DIAGNOSIS — G894 Chronic pain syndrome: Secondary | ICD-10-CM | POA: Diagnosis not present

## 2020-02-01 DIAGNOSIS — Z01419 Encounter for gynecological examination (general) (routine) without abnormal findings: Secondary | ICD-10-CM | POA: Diagnosis not present

## 2020-02-01 DIAGNOSIS — Z6830 Body mass index (BMI) 30.0-30.9, adult: Secondary | ICD-10-CM | POA: Diagnosis not present

## 2020-02-18 DIAGNOSIS — Z20822 Contact with and (suspected) exposure to covid-19: Secondary | ICD-10-CM | POA: Diagnosis not present

## 2020-02-27 DIAGNOSIS — M25562 Pain in left knee: Secondary | ICD-10-CM | POA: Diagnosis not present

## 2020-02-27 DIAGNOSIS — M5030 Other cervical disc degeneration, unspecified cervical region: Secondary | ICD-10-CM | POA: Diagnosis not present

## 2020-02-28 DIAGNOSIS — M25562 Pain in left knee: Secondary | ICD-10-CM | POA: Diagnosis not present

## 2020-02-28 DIAGNOSIS — M7062 Trochanteric bursitis, left hip: Secondary | ICD-10-CM | POA: Diagnosis not present

## 2020-03-14 DIAGNOSIS — H6691 Otitis media, unspecified, right ear: Secondary | ICD-10-CM | POA: Diagnosis not present

## 2020-05-20 DIAGNOSIS — M25562 Pain in left knee: Secondary | ICD-10-CM | POA: Diagnosis not present

## 2020-05-20 DIAGNOSIS — M7062 Trochanteric bursitis, left hip: Secondary | ICD-10-CM | POA: Insufficient documentation

## 2020-05-20 DIAGNOSIS — M25561 Pain in right knee: Secondary | ICD-10-CM | POA: Diagnosis not present

## 2020-05-31 DIAGNOSIS — R079 Chest pain, unspecified: Secondary | ICD-10-CM | POA: Diagnosis not present

## 2020-06-10 DIAGNOSIS — H6983 Other specified disorders of Eustachian tube, bilateral: Secondary | ICD-10-CM | POA: Diagnosis not present

## 2020-06-10 DIAGNOSIS — R059 Cough, unspecified: Secondary | ICD-10-CM | POA: Diagnosis not present

## 2020-06-10 DIAGNOSIS — H6691 Otitis media, unspecified, right ear: Secondary | ICD-10-CM | POA: Diagnosis not present

## 2020-06-10 DIAGNOSIS — R0981 Nasal congestion: Secondary | ICD-10-CM | POA: Diagnosis not present

## 2020-06-12 DIAGNOSIS — M25561 Pain in right knee: Secondary | ICD-10-CM | POA: Diagnosis not present

## 2020-06-17 DIAGNOSIS — M25561 Pain in right knee: Secondary | ICD-10-CM | POA: Insufficient documentation

## 2020-06-18 DIAGNOSIS — M222X1 Patellofemoral disorders, right knee: Secondary | ICD-10-CM | POA: Diagnosis not present

## 2020-06-18 DIAGNOSIS — M222X2 Patellofemoral disorders, left knee: Secondary | ICD-10-CM | POA: Diagnosis not present

## 2020-06-21 DIAGNOSIS — M25561 Pain in right knee: Secondary | ICD-10-CM | POA: Diagnosis not present

## 2020-06-21 DIAGNOSIS — M25562 Pain in left knee: Secondary | ICD-10-CM | POA: Diagnosis not present

## 2020-07-28 DIAGNOSIS — H6691 Otitis media, unspecified, right ear: Secondary | ICD-10-CM | POA: Diagnosis not present

## 2020-07-28 DIAGNOSIS — H6981 Other specified disorders of Eustachian tube, right ear: Secondary | ICD-10-CM | POA: Diagnosis not present

## 2020-08-06 DIAGNOSIS — M25562 Pain in left knee: Secondary | ICD-10-CM | POA: Diagnosis not present

## 2020-08-06 DIAGNOSIS — M25561 Pain in right knee: Secondary | ICD-10-CM | POA: Diagnosis not present

## 2020-09-12 DIAGNOSIS — M26629 Arthralgia of temporomandibular joint, unspecified side: Secondary | ICD-10-CM | POA: Diagnosis not present

## 2020-09-12 DIAGNOSIS — H6983 Other specified disorders of Eustachian tube, bilateral: Secondary | ICD-10-CM | POA: Diagnosis not present

## 2020-11-15 DIAGNOSIS — M7062 Trochanteric bursitis, left hip: Secondary | ICD-10-CM | POA: Diagnosis not present

## 2020-12-19 NOTE — Progress Notes (Signed)
Jade Render, DNP, AGNP-c Primary Care & Sports Medicine [address]  [place], [place] [address] [phone]  New patient visit   Patient: Jade Raymond   DOB: 1990-05-26   30 y.o. Female  MRN: [id number] Visit Date: 12/20/2020  Patient Care Team: Zai Chmiel, Coralee Pesa, NP as PCP - General (Nurse Practitioner) Patient, No Pcp Per (Inactive) (General Practice)  Today's healthcare provider: Orma Render, NP   Chief Complaint  Patient presents with   Establish Care   Subjective    Jade Raymond is a 30 y.o. female who presents today as a new patient to establish care.  HPI  Sleep Endorses chronic insomnia varying from difficulty falling asleep to Khalessi Blough morning awakenings Denies anxiety, depression, or mania symptoms  Reports that when she wakes up she immediately gets up.  Both parents have had similar concerns Tells me that occasionally she will take a benadryl or nyquil to help her sleep and during those times she will get about 8 hours.  Typically will go 3-4 days with minimal sleep then take something to help her rest.  Does not feel tired during the day and is able to function with her job.   Joint Pain Endorses chronic varying joint pain that has been present for the past 2-3 years Sees [facility] for dry needling every 4 weeks to help with this.  Joints involved include hands, shoulder, back, jaw, and hip.  She has MRI on Wednesday on hip In the past has also had injections in the joints and PT- were not helpful  Endorses pain is worse when she has been resting and gets up to move around. No pain on AM awakening, just throughout the day when she has sat.  No warmth or swelling in the areas of pain. Not only one side of the body Walks every day several times with her dogs- able to walk with no issues.  [facility] needs.   Past Medical History:  Diagnosis Date   Allergy    History of kidney  stones    Past Surgical History:  Procedure Laterality Date   CESAREAN SECTION  2018   WISDOM TOOTH EXTRACTION     Family Status  Relation Name Status   Mother  (Not Specified)   Father  (Not Specified)   Brother  (Not Specified)   Mat Uncle  (Not Specified)   MGM  (Not Specified)   MGF  (Not Specified)   PGM  (Not Specified)   PGF  (Not Specified)   Family History  Problem Relation Age of Onset   Diabetes Mother    Diabetes Father    Pulmonary embolism Brother    Cancer Maternal Uncle    Stroke Maternal Grandmother    Cancer Maternal Grandmother    Heart attack Maternal Grandfather    Deep vein thrombosis Maternal Grandfather    Thyroid disease Maternal Grandfather    Deep vein thrombosis Paternal Grandmother    Diabetes Paternal Grandmother    Heart attack Paternal Grandfather    Social History   Socioeconomic History   Marital status: Single    Spouse name: Not on file   Number of children: Not on file   Years of education: Not on file   Highest education level: Not on file  Occupational History    Comment: Paralegal  Tobacco Use   Smoking status: Never   Smokeless tobacco: Never  Vaping Use  Vaping Use: Never used  Substance and Sexual Activity   Alcohol use: No    Alcohol/week: 0.0 standard drinks   Drug use: No   Sexual activity: Yes  Other Topics Concern   Not on file  Social History Narrative   ** Merged History Encounter **       Social Determinants of Health   Financial Resource Strain: Not on file  Food Insecurity: Not on file  Transportation Needs: Not on file  Physical Activity: Not on file  Stress: Not on file  Social Connections: Not on file   Outpatient Medications Prior to Visit  Medication Sig   benzoyl peroxide (PANOXYL CREAMY WASH) 4 % external liquid 1 application   cetirizine (ZYRTEC ALLERGY) 10 MG tablet Zyrtec   levonorgestrel-ethinyl estradiol (NORDETTE) 0.15-30 MG-MCG tablet Altavera (28) 0.15 mg-0.03 mg tablet   No  facility-administered medications prior to visit.   Allergies  Allergen Reactions   Chlorhexidine Hives, Itching and Rash    Immunization History  Administered Date(s) Administered   Influenza,inj,Quad PF,6+ Mos 12/20/2020   Influenza,inj,quad, With Preservative 12/01/2018   PFIZER(Purple Top)SARS-COV-2 Vaccination 10/03/2019, 10/24/2019, 04/13/2020    Health Maintenance  Topic Date Due   Pneumococcal Vaccine 52-49 Years old (1 - PCV) Never done   TETANUS/TDAP  Never done   COVID-19 Vaccine (4 - Booster for Pfizer series) 06/08/2020   PAP SMEAR-Modifier  02/01/2023   INFLUENZA VACCINE  Completed   HPV VACCINES  Aged Out   Hepatitis C Screening  Discontinued   HIV Screening  Discontinued    Patient Care Team: Chinmayi Rumer, Coralee Pesa, NP as PCP - General (Nurse Practitioner) Patient, No Pcp Per (Inactive) (General Practice)  Review of Systems All review of systems negative except what is listed in the HPI   Objective    BP 114/85   Pulse 83   Ht 5\' 4"  (1.626 m)   Wt 178 lb 6.4 oz (80.9 kg)   SpO2 100%   BMI 30.62 kg/m  Physical Exam Vitals and nursing note reviewed.  Constitutional:      General: She is not in acute distress.    Appearance: Normal appearance.  HENT:     Head: Normocephalic.  Eyes:     Extraocular Movements: Extraocular movements intact.     Conjunctiva/sclera: Conjunctivae normal.     Pupils: Pupils are equal, round, and reactive to light.  Cardiovascular:     Rate and Rhythm: Normal rate. Rhythm irregular.     Pulses: Normal pulses.     Heart sounds: Normal heart sounds.  Pulmonary:     Effort: Pulmonary effort is normal.     Breath sounds: Normal breath sounds.  Abdominal:     Palpations: Abdomen is soft.  Musculoskeletal:     Right lower leg: No edema.     Left lower leg: No edema.  Skin:    General: Skin is warm and dry.     Capillary Refill: Capillary refill takes less than 2 seconds.  Neurological:     General: No focal deficit present.      Mental Status: She is alert and oriented to person, place, and time.     Motor: No weakness.     Gait: Gait normal.  Psychiatric:        Mood and Affect: Mood normal.        Behavior: Behavior normal.        Thought Content: Thought content normal.        Judgment: Judgment normal.  Depression Screen PHQ 2/9 Scores 12/20/2020 04/17/2015  PHQ - 2 Score 0 0  PHQ- 9 Score 3 -   No results found for any visits on 12/20/20.  Assessment & Plan      Problem List Items Addressed This Visit     Encounter for medical examination to establish care - Primary    Review of current and past medical history, social history, medication, and family history.  Review of care gaps and health maintenance recommendations.  Records from recent providers to be requested if not available in Chart Review or Care Everywhere.  Recommendations for health maintenance, diet, and exercise provided.  Labs today: CBC, CMP, Lipids, TSH, Vit D HM Recommendations: Flu vaccine CPE due: 1 year       Relevant Medications   hydrOXYzine (VISTARIL) 25 MG capsule   Arthralgia    Multipoint joint pain for the past 2-3 years of unknown etiology Has been managed with ortho for concerns.  Symptoms worse when she has been sitting for any period of time, but absent on AM awakening.  Etiology unclear at this time. Recommend work-up with rheumatology- referral placed today Will obtain labs for thyroid today, but will leave further evaluation to rheumatology based on their expertise.  OK to continue with dry needling as this is helpful.       Relevant Orders   CBC With Differential   Comprehensive metabolic panel   Lipid panel   TSH   VITAMIN D 25 Hydroxy (Vit-D Deficiency, Fractures)   Ambulatory referral to Rheumatology   Primary insomnia    Symptoms consistent with chronic insomnia of unknown etiology No signs of depression, anxiety, mania present today Reports functioning well on little sleep, but concerns  are present for the long term effects that this may have. Discussed option of hydroxyzine PRN for sleep. She is agreeable to try.  Will follow-up if symptoms worsen or fail to improve.       Other Visit Diagnoses     Screening for lipid disorders       Relevant Orders   Lipid panel   Screening for thyroid disorder       Relevant Orders   TSH   Screening for endocrine, nutritional, metabolic and immunity disorder       Relevant Orders   CBC With Differential   Comprehensive metabolic panel   Lipid panel   TSH   VITAMIN D 25 Hydroxy (Vit-D Deficiency, Fractures)   Need for immunization against influenza       Relevant Orders   Flu Vaccine QUAD 22mo+IM (Fluarix, Fluzone & Alfiuria Quad PF) (Completed)        Return in about 1 year (around 12/20/2021) for CPE and labs.      Ree Alcalde, Coralee Pesa, NP, DNP, AGNP-C Primary Care & Sports Medicine at [place]

## 2020-12-19 NOTE — Patient Instructions (Addendum)
Recommendations from today's visit: We will let you know if there are any abnormalities from your labs that we need to address I have sent a referral to rheumatology for an evaluation for your joint pain.  If you have any needs, please let us know.  We can plan on a physical exam and labs in a year. I do recommend getting your COVID vaccine in the next month or so since your natural immunity will begin to decrease.  I have sent in hydroxyzine to use intermittently to help with your sleep. Take this about 30 minutes before bedtime as needed.   Information on diet, exercise, and health maintenance recommendations are listed below. This is information to help you be sure you are on track for optimal health and monitoring.   Please look over this and let us know if you have any questions or if you have completed any of the health maintenance outside of [facility] so that we can be sure your records are up to date.  ___________________________________________________________  Thank you for choosing [facility] for your Primary Care needs. I am excited for the opportunity to partner with you to meet your health care goals. It was a pleasure meeting you today!  I am an Adult-Geriatric Nurse Practitioner with a background in caring for patients for more than 20 years. I provide primary care and sports medicine services to patients age 87 and older within this office. I am also the director of the [facility] with [facility].   I am passionate about providing the best service to you through preventive medicine and supportive care. I consider you a part of the medical team and value your input. I work diligently to ensure that you are heard and your needs are met in a safe and effective manner. I want you to feel comfortable with me as your provider and want you to know that your health concerns are important to me.  For your information, our office hours are Monday-  Friday 8:00 AM - 5:00 PM At this time I am not in the office on Wednesdays.  If you have questions or concerns, please call our office at [phone] or send [place] a MyChart message and we will respond as quickly as possible.   For all urgent or time sensitive needs we ask that you please call the office to avoid delays. MyChart is not constantly monitored and replies may take up to 72 business hours.  MyChart Policy: MyChart allows for you to see your visit notes, after visit summary, provider recommendations, lab and tests results, make an appointment, request refills, and contact your provider or the office for non-urgent questions or concerns. Providers are seeing patients during normal business hours and do not have built in time to review MyChart messages.  We ask that you allow a minimum of 4 business days for responses to [employer]. For this reason, please do not send urgent requests through [place]. Please call the office at [phone]. Complex MyChart concerns may require a visit. Your provider may request you schedule a virtual or in person visit to ensure we are providing the best care possible. MyChart messages sent after 4:00 PM on Friday will not be received by the provider until Monday morning.    Lab and Test Results: You will receive your lab and test results on MyChart as soon as they are completed and results have been sent by the lab or testing facility. Due to this  service, you will receive your results BEFORE your provider.  I review lab and tests results each morning prior to seeing patients. Some results require collaboration with other providers to ensure you are receiving the most appropriate care. For this reason, we ask that you please allow a minimum of 4 business days for your provider to receive and review lab and test results and contact you about these.  Most lab and test result comments from the provider will be sent through [place]. Your provider may  recommend changes to the plan of care, follow-up visits, repeat testing, ask questions, or request an office visit to discuss these results. You may reply directly to this message or call the office at [phone] to provide information for the provider or set up an appointment. In some instances, you will be called with test results and recommendations. Please let us know if this is preferred and we will make note of this in your chart to provide this for you.    If you have not heard a response to your lab or test results in 72 business hours, please call the office to let us know.   After Hours: For all non-emergency after hours needs, please call the office at [phone] and select the option to reach the on-call provider service. On-call services are shared between multiple [place] offices and therefore it will not be possible to speak directly with your provider. On-call providers may provide medical advice and recommendations, but are unable to provide refills for maintenance medications.  For all emergency or urgent medical needs after normal business hours, we recommend that you seek care at the closest Urgent Care or Emergency Department to ensure appropriate treatment in a timely manner.  [facility] has a 24 hour emergency room located on the ground floor for your convenience.    Please do not hesitate to reach out to [place] with concerns.   Thank you, again, for choosing me as your health care partner. I appreciate your trust and look forward to learning more about you.   Worthy Keeler, DNP, AGNP-c ___________________________________________________________  Health Maintenance Recommendations Screening Testing Mammogram Every 1 -2 years based on history and risk factors Starting at age 51 Pap Smear Ages 21-39 every 3 years Ages 28-65 every 5 years with HPV testing More frequent testing may be required based on results and history Colon Cancer  Screening Every 1-10 years based on test performed, risk factors, and history Starting at age 30 Bone Density Screening Every 2-10 years based on history Starting at age 67 for women Recommendations for men differ based on medication usage, history, and risk factors AAA Screening One time ultrasound Men 31-89 years old who have every smoked Lung Cancer Screening Low Dose Lung CT every 12 months Age 31-80 years with a 30 pack-year smoking history who still smoke or who have quit within the last 15 years  Screening Labs Routine  Labs: Complete Blood Count (CBC), Complete Metabolic Panel (CMP), Cholesterol (Lipid Panel) Every 6-12 months based on history and medications May be recommended more frequently based on current conditions or previous results Hemoglobin A1c Lab Every 3-12 months based on history and previous results Starting at age 3 or earlier with diagnosis of diabetes, high cholesterol, BMI >26, and/or risk factors Frequent monitoring for patients with diabetes to ensure blood sugar control Thyroid Panel (TSH w/ T3 & T4) Every 6 months based on history, symptoms, and risk factors May be repeated more often if on medication  HIV One time testing for all patients 13 and older May be repeated more frequently for patients with increased risk factors or exposure Hepatitis C One time testing for all patients 73 and older May be repeated more frequently for patients with increased risk factors or exposure Gonorrhea, Chlamydia Every 12 months for all sexually active persons 13-24 years Additional monitoring may be recommended for those who are considered high risk or who have symptoms PSA Men 42-91 years old with risk factors Additional screening may be recommended from age 13-69 based on risk factors, symptoms, and history  Vaccine Recommendations Tetanus Booster All adults every 10 years Flu Vaccine All patients 6 months and older every year COVID Vaccine All patients  12 years and older Initial dosing with booster May recommend additional booster based on age and health history HPV Vaccine 2 doses all patients age 42-26 Dosing may be considered for patients over 26 Shingles Vaccine (Shingrix) 2 doses all adults 74 years and older Pneumonia (Pneumovax 23) All adults 33 years and older May recommend earlier dosing based on health history Pneumonia (Prevnar 8) All adults 22 years and older Dosed 1 year after Pneumovax 23  Additional Screening, Testing, and Vaccinations may be recommended on an individualized basis based on family history, health history, risk factors, and/or exposure.  __________________________________________________________  Diet Recommendations for All Patients  I recommend that all patients maintain a diet low in saturated fats, carbohydrates, and cholesterol. While this can be challenging at first, it is not impossible and small changes can make big differences.  Things to try: Decreasing the amount of soda, sweet tea, and/or juice to one or less per day and replace with water While water is always the first choice, if you do not like water you may consider adding a water additive without sugar to improve the taste other sugar free drinks Replace potatoes with a brightly colored vegetable at dinner Use healthy oils, such as canola oil or olive oil, instead of butter or hard margarine Limit your bread intake to two pieces or less a day Replace regular pasta with low carb pasta options Bake, broil, or grill foods instead of frying Monitor portion sizes  Eat smaller, more frequent meals throughout the day instead of large meals  An important thing to remember is, if you love foods that are not great for your health, you don't have to give them up completely. Instead, allow these foods to be a reward when you have done well. Allowing yourself to still have special treats every once in a while is a nice way to tell yourself thank  you for working hard to keep yourself healthy.   Also remember that every day is a new day. If you have a bad day and "fall off the wagon", you can still climb right back up and keep moving along on your journey!  We have resources available to help you!  Some websites that may be helpful include: www.http://carter.biz/  Www.VeryWellFit.com _____________________________________________________________  Activity Recommendations for All Patients  I recommend that all adults get at least 20 minutes of moderate physical activity that elevates your heart rate at least 5 days out of the week.  Some examples include: Walking or jogging at a pace that allows you to carry on a conversation Cycling (stationary bike or outdoors) Water aerobics Yoga Weight lifting Dancing If physical limitations prevent you from putting stress on your joints, exercise in a pool or seated in a chair are excellent options.  Do determine your  MAXIMUM heart rate for activity: YOUR AGE - 220 = MAX HeartRate   Remember! Do not push yourself too hard.  Start slowly and build up your pace, speed, weight, time in exercise, etc.  Allow your body to rest between exercise and get good sleep. You will need more water than normal when you are exerting yourself. Do not wait until you are thirsty to drink. Drink with a purpose of getting in at least 8, 8 ounce glasses of water a day plus more depending on how much you exercise and sweat.    If you begin to develop dizziness, chest pain, abdominal pain, jaw pain, shortness of breath, headache, vision changes, lightheadedness, or other concerning symptoms, stop the activity and allow your body to rest. If your symptoms are severe, seek emergency evaluation immediately. If your symptoms are concerning, but not severe, please let us know so that we can recommend further evaluation.   ________________________________________________________________

## 2020-12-20 ENCOUNTER — Other Ambulatory Visit: Payer: Self-pay

## 2020-12-20 ENCOUNTER — Encounter (HOSPITAL_BASED_OUTPATIENT_CLINIC_OR_DEPARTMENT_OTHER): Payer: Self-pay | Admitting: Nurse Practitioner

## 2020-12-20 ENCOUNTER — Ambulatory Visit (HOSPITAL_BASED_OUTPATIENT_CLINIC_OR_DEPARTMENT_OTHER): Payer: BC Managed Care – PPO | Admitting: Nurse Practitioner

## 2020-12-20 VITALS — BP 114/85 | HR 83 | Ht 64.0 in | Wt 178.4 lb

## 2020-12-20 DIAGNOSIS — Z23 Encounter for immunization: Secondary | ICD-10-CM

## 2020-12-20 DIAGNOSIS — G43909 Migraine, unspecified, not intractable, without status migrainosus: Secondary | ICD-10-CM | POA: Insufficient documentation

## 2020-12-20 DIAGNOSIS — Z13220 Encounter for screening for lipoid disorders: Secondary | ICD-10-CM

## 2020-12-20 DIAGNOSIS — M2550 Pain in unspecified joint: Secondary | ICD-10-CM

## 2020-12-20 DIAGNOSIS — L719 Rosacea, unspecified: Secondary | ICD-10-CM | POA: Insufficient documentation

## 2020-12-20 DIAGNOSIS — Z1329 Encounter for screening for other suspected endocrine disorder: Secondary | ICD-10-CM

## 2020-12-20 DIAGNOSIS — F5101 Primary insomnia: Secondary | ICD-10-CM

## 2020-12-20 DIAGNOSIS — Z13228 Encounter for screening for other metabolic disorders: Secondary | ICD-10-CM

## 2020-12-20 DIAGNOSIS — Z130 Encounter for screening for diseases of the blood and blood-forming organs and certain disorders involving the immune mechanism: Secondary | ICD-10-CM

## 2020-12-20 DIAGNOSIS — Z1321 Encounter for screening for nutritional disorder: Secondary | ICD-10-CM | POA: Diagnosis not present

## 2020-12-20 DIAGNOSIS — D239 Other benign neoplasm of skin, unspecified: Secondary | ICD-10-CM | POA: Insufficient documentation

## 2020-12-20 DIAGNOSIS — Z0000 Encounter for general adult medical examination without abnormal findings: Secondary | ICD-10-CM | POA: Insufficient documentation

## 2020-12-20 MED ORDER — HYDROXYZINE PAMOATE 25 MG PO CAPS: 25.0000 mg | ORAL_CAPSULE | Freq: Every evening | ORAL | 6 refills | Status: DC | PRN

## 2020-12-20 NOTE — Assessment & Plan Note (Signed)
Multipoint joint pain for the past 2-3 years of unknown etiology Has been managed with ortho for concerns.  Symptoms worse when she has been sitting for any period of time, but absent on AM awakening.  Etiology unclear at this time. Recommend work-up with rheumatology- referral placed today Will obtain labs for thyroid today, but will leave further evaluation to rheumatology based on their expertise.  OK to continue with dry needling as this is helpful.

## 2020-12-20 NOTE — Assessment & Plan Note (Signed)
Review of current and past medical history, social history, medication, and family history.  Review of care gaps and health maintenance recommendations.  Records from recent providers to be requested if not available in Chart Review or Care Everywhere.  Recommendations for health maintenance, diet, and exercise provided.  Labs today: CBC, CMP, Lipids, TSH, Vit D HM Recommendations: Flu vaccine CPE due: 1 year

## 2020-12-20 NOTE — Assessment & Plan Note (Signed)
Symptoms consistent with chronic insomnia of unknown etiology No signs of depression, anxiety, mania present today Reports functioning well on little sleep, but concerns are present for the long term effects that this may have. Discussed option of hydroxyzine PRN for sleep. She is agreeable to try.  Will follow-up if symptoms worsen or fail to improve.

## 2020-12-21 LAB — COMPREHENSIVE METABOLIC PANEL
ALT: 17 IU/L (ref 0–32)
AST: 26 IU/L (ref 0–40)
Albumin/Globulin Ratio: 2 (ref 1.2–2.2)
Albumin: 4.7 g/dL (ref 3.8–4.8)
Alkaline Phosphatase: 65 IU/L (ref 44–121)
BUN/Creatinine Ratio: 17 (ref 9–23)
BUN: 12 mg/dL (ref 6–20)
Bilirubin Total: 0.3 mg/dL (ref 0.0–1.2)
CO2: 21 mmol/L (ref 20–29)
Calcium: 9.6 mg/dL (ref 8.7–10.2)
Chloride: 102 mmol/L (ref 96–106)
Creatinine, Ser: 0.69 mg/dL (ref 0.57–1.00)
Globulin, Total: 2.3 g/dL (ref 1.5–4.5)
Glucose: 89 mg/dL (ref 70–99)
Potassium: 3.9 mmol/L (ref 3.5–5.2)
Sodium: 138 mmol/L (ref 134–144)
Total Protein: 7 g/dL (ref 6.0–8.5)
eGFR: 118 mL/min/{1.73_m2} (ref >59)

## 2020-12-21 LAB — LIPID PANEL
Chol/HDL Ratio: 3.2 ratio (ref 0.0–4.4)
Cholesterol, Total: 214 mg/dL — ABNORMAL HIGH (ref 100–199)
HDL: 66 mg/dL (ref >39)
LDL Chol Calc (NIH): 137 mg/dL — ABNORMAL HIGH (ref 0–99)
Triglycerides: 64 mg/dL (ref 0–149)
VLDL Cholesterol Cal: 11 mg/dL (ref 5–40)

## 2020-12-21 LAB — CBC WITH DIFFERENTIAL
Basophils Absolute: 0 10*3/uL (ref 0.0–0.2)
Basos: 1 %
EOS (ABSOLUTE): 0.1 10*3/uL (ref 0.0–0.4)
Eos: 1 %
Hematocrit: 41.4 % (ref 34.0–46.6)
Hemoglobin: 13.5 g/dL (ref 11.1–15.9)
Immature Grans (Abs): 0 10*3/uL (ref 0.0–0.1)
Immature Granulocytes: 0 %
Lymphocytes Absolute: 1.8 10*3/uL (ref 0.7–3.1)
Lymphs: 25 %
MCH: 29.9 pg (ref 26.6–33.0)
MCHC: 32.6 g/dL (ref 31.5–35.7)
MCV: 92 fL (ref 79–97)
Monocytes Absolute: 0.4 10*3/uL (ref 0.1–0.9)
Monocytes: 6 %
Neutrophils Absolute: 4.9 10*3/uL (ref 1.4–7.0)
Neutrophils: 67 %
RBC: 4.51 x10E6/uL (ref 3.77–5.28)
RDW: 11.5 % — ABNORMAL LOW (ref 11.7–15.4)
WBC: 7.2 10*3/uL (ref 3.4–10.8)

## 2020-12-21 LAB — TSH: TSH: 0.94 u[IU]/mL (ref 0.450–4.500)

## 2020-12-21 LAB — VITAMIN D 25 HYDROXY (VIT D DEFICIENCY, FRACTURES): Vit D, 25-Hydroxy: 34.7 ng/mL (ref 30.0–100.0)

## 2020-12-25 DIAGNOSIS — M25552 Pain in left hip: Secondary | ICD-10-CM | POA: Diagnosis not present

## 2021-01-22 NOTE — Progress Notes (Deleted)
Office Visit Note  Patient: Jade Raymond             Date of Birth: Sep 07, 1990           MRN: [id number]             PCP: Orma Render, NP Referring: Orma Render, NP Visit Date: 01/28/2021 Occupation: @GUAROCC @  Subjective:  No chief complaint on file.   History of Present Illness: Jade Raymond is a 30 y.o. female ***   Activities of Daily Living:  Patient reports morning stiffness for *** {minute/hour:19697}.   Patient {ACTIONS;DENIES/REPORTS:21021675::"Denies"} nocturnal pain.  Difficulty dressing/grooming: {ACTIONS;DENIES/REPORTS:21021675::"Denies"} Difficulty climbing stairs: {ACTIONS;DENIES/REPORTS:21021675::"Denies"} Difficulty getting out of chair: {ACTIONS;DENIES/REPORTS:21021675::"Denies"} Difficulty using hands for taps, buttons, cutlery, and/or writing: {ACTIONS;DENIES/REPORTS:21021675::"Denies"}  No Rheumatology ROS completed.   PMFS History:  Patient Active Problem List   Diagnosis Date Noted   Encounter for medical examination to establish care 12/20/2020   Arthralgia 12/20/2020   Dermatofibroma 12/20/2020   Migraine 12/20/2020   Rosacea 12/20/2020   Primary insomnia 12/20/2020   TMJ pain dysfunction syndrome 09/12/2020   Patellofemoral syndrome of both knees 06/18/2020   Pain in joint of right knee 06/17/2020   Trochanteric bursitis of left hip 05/20/2020   DDD (degenerative disc disease), cervical 08/01/2019   Neck pain 07/14/2019   Thoracic back pain 07/14/2019   Pain in joint of right shoulder 04/21/2019    Past Medical History:  Diagnosis Date   Allergy    History of kidney stones     Family History  Problem Relation Age of Onset   Diabetes Mother    Diabetes Father    Pulmonary embolism Brother    Cancer Maternal Uncle    Stroke Maternal Grandmother    Cancer Maternal Grandmother    Heart attack Maternal Grandfather    Deep vein thrombosis Maternal Grandfather    Thyroid disease Maternal Grandfather    Deep vein thrombosis  Paternal Grandmother    Diabetes Paternal Grandmother    Heart attack Paternal Grandfather    Past Surgical History:  Procedure Laterality Date   CESAREAN SECTION  2018   WISDOM TOOTH EXTRACTION     Social History   Social History Narrative   ** Merged History Encounter **       Immunization History  Administered Date(s) Administered   Influenza,inj,Quad PF,6+ Mos 12/20/2020   Influenza,inj,quad, With Preservative 12/01/2018   PFIZER(Purple Top)SARS-COV-2 Vaccination 10/03/2019, 10/24/2019, 04/13/2020     Objective: Vital Signs: There were no vitals taken for this visit.   Physical Exam   Musculoskeletal Exam: ***  CDAI Exam: CDAI Score: -- Patient Global: --; Provider Global: -- Swollen: --; Tender: -- Joint Exam 01/28/2021   No joint exam has been documented for this visit   There is currently no information documented on the homunculus. Go to the Rheumatology activity and complete the homunculus joint exam.  Investigation: No additional findings.  Imaging: No results found.  Recent Labs: Lab Results  Component Value Date   WBC 7.2 12/20/2020   HGB 13.5 12/20/2020   PLT 272 03/12/2007   NA 138 12/20/2020   K 3.9 12/20/2020   CL 102 12/20/2020   CO2 21 12/20/2020   GLUCOSE 89 12/20/2020   BUN 12 12/20/2020   CREATININE 0.69 12/20/2020   BILITOT 0.3 12/20/2020   ALKPHOS 65 12/20/2020   AST 26 12/20/2020   ALT 17 12/20/2020   PROT 7.0 12/20/2020   ALBUMIN 4.7 12/20/2020   CALCIUM 9.6 12/20/2020  GFRAA  03/12/2007    >60        The eGFR has been calculated using the MDRD equation. This calculation has not been validated in all clinical    Speciality Comments: No specialty comments available.  Procedures:  No procedures performed Allergies: Chlorhexidine   Assessment / Plan:     Visit Diagnoses: Polyarthralgia  Trochanteric bursitis of left hip  TMJ pain dysfunction syndrome  Rosacea  Patellofemoral syndrome of both  knees  Dermatofibroma  DDD (degenerative disc disease), cervical  Primary insomnia  Hx of migraines  Orders: No orders of the defined types were placed in this encounter.  No orders of the defined types were placed in this encounter.   Face-to-face time spent with patient was *** minutes. Greater than 50% of time was spent in counseling and coordination of care.  Follow-Up Instructions: No follow-ups on file.   Ofilia Neas, PA-C  Note - This record has been created using Dragon software.  Chart creation errors have been sought, but may not always  have been located. Such creation errors do not reflect on  the standard of medical care.

## 2021-01-28 ENCOUNTER — Ambulatory Visit: Payer: BC Managed Care – PPO | Admitting: Internal Medicine

## 2021-01-28 DIAGNOSIS — M222X1 Patellofemoral disorders, right knee: Secondary | ICD-10-CM

## 2021-01-28 DIAGNOSIS — F5101 Primary insomnia: Secondary | ICD-10-CM

## 2021-01-28 DIAGNOSIS — L719 Rosacea, unspecified: Secondary | ICD-10-CM

## 2021-01-28 DIAGNOSIS — Z8669 Personal history of other diseases of the nervous system and sense organs: Secondary | ICD-10-CM

## 2021-01-28 DIAGNOSIS — M7062 Trochanteric bursitis, left hip: Secondary | ICD-10-CM

## 2021-01-28 DIAGNOSIS — M26629 Arthralgia of temporomandibular joint, unspecified side: Secondary | ICD-10-CM

## 2021-01-28 DIAGNOSIS — M2550 Pain in unspecified joint: Secondary | ICD-10-CM

## 2021-01-28 DIAGNOSIS — M5030 Other cervical disc degeneration, unspecified cervical region: Secondary | ICD-10-CM

## 2021-01-28 DIAGNOSIS — D239 Other benign neoplasm of skin, unspecified: Secondary | ICD-10-CM

## 2021-02-10 DIAGNOSIS — L719 Rosacea, unspecified: Secondary | ICD-10-CM | POA: Diagnosis not present

## 2021-02-26 ENCOUNTER — Ambulatory Visit: Payer: BC Managed Care – PPO | Admitting: Internal Medicine

## 2021-03-13 NOTE — Progress Notes (Signed)
Office Visit Note  Patient: Jade Raymond             Date of Birth: 02-24-1991           MRN: [id number]             PCP: Orma Render, NP Referring: Orma Render, NP Visit Date: 03/14/2021 Occupation: Paralegal staff  Subjective:  New Patient (Initial Visit) (Bursitis, left hip, right sided rib pain, difficulty getting out of a chair, difficulty sleeping and changing positions)   History of Present Illness: Jade Raymond is a 31 y.o. female here for evaluation of chronic joint pain in multiple areas. Joint pains are ongoing for at least about 2-3 years. She had chronic pain in the right shoulder with no specific cause found on exam or xray or MRI imaging. Subsequently some cervical spine degenerative disease was identified as a possible underlying cause. She saw orthopedics and PT for treatments including dry needling which she found to be the most helpful and resolved symptoms over time. More recently her left hip is worst also thoroughly evaluated MRI and exam consistent with trochanteric bursitis and this has also been an ongoing problem. So far she reports no benefit with physical therapy and some gradual improvement with the dry needling treatment. She has no weakness or decreased movement just pain usually worst when first getting up from a stationary position and improves after walking a short time. More recently after cold symptoms around Christmas she has pain on the right side of her chest. This does not feel similar to past costochondritis. Pain is provoked with direct pressure but also with any forward flexion and rotation movements. She has chronic insomnia this has been a chronic problem for years. She has tried melatonin without much benefit. Tylenol PM medicine helps intermittently. She tried hydroxyzine and felt terrible mood disturbance. This is not associated with much daytime somnolence.  Activities of Daily Living:  Patient reports morning stiffness for 10 minutes.    Patient Reports nocturnal pain.  Difficulty dressing/grooming: Reports Difficulty climbing stairs: Reports Difficulty getting out of chair: Reports Difficulty using hands for taps, buttons, cutlery, and/or writing: Denies  Review of Systems  Constitutional:  Positive for fatigue.  HENT:  Negative for mouth dryness.   Eyes:  Negative for dryness.  Respiratory:  Negative for shortness of breath.   Cardiovascular:  Negative for swelling in legs/feet.  Gastrointestinal:  Negative for constipation.  Endocrine: Positive for heat intolerance.  Genitourinary:  Negative for difficulty urinating.  Musculoskeletal:  Positive for joint pain, joint pain and morning stiffness.  Skin:  Negative for rash.  Allergic/Immunologic: Negative for susceptible to infections.  Neurological:  Negative for numbness.  Hematological:  Negative for bruising/bleeding tendency.  Psychiatric/Behavioral:  Positive for sleep disturbance.    PMFS History:  Patient Active Problem List   Diagnosis Date Noted   Rib pain on right side 03/14/2021   Other fatigue 03/14/2021   Encounter for medical examination to establish care 12/20/2020   Arthralgia 12/20/2020   Dermatofibroma 12/20/2020   Migraine 12/20/2020   Rosacea 12/20/2020   Primary insomnia 12/20/2020   TMJ pain dysfunction syndrome 09/12/2020   Patellofemoral syndrome of both knees 06/18/2020   Pain in joint of right knee 06/17/2020   Trochanteric bursitis of left hip 05/20/2020   DDD (degenerative disc disease), cervical 08/01/2019   Neck pain 07/14/2019   Thoracic back pain 07/14/2019   Pain in joint of right shoulder 04/21/2019  Past Medical History:  Diagnosis Date   Allergy    Costochondritis     Family History  Problem Relation Age of Onset   Asthma Mother    Cancer Maternal Uncle    Stroke Maternal Grandmother    Cancer Maternal Grandmother    Heart attack Maternal Grandfather    Deep vein thrombosis Maternal Grandfather    Thyroid  disease Maternal Grandfather    Deep vein thrombosis Paternal Grandmother    Diabetes Paternal Grandmother    Heart attack Paternal Grandfather    Past Surgical History:  Procedure Laterality Date   APPENDECTOMY     CHOLECYSTECTOMY     KNEE SURGERY Left    WISDOM TOOTH EXTRACTION     Social History   Social History Narrative   ** Merged History Encounter **       Immunization History  Administered Date(s) Administered   Influenza,inj,Quad PF,6+ Mos 12/20/2020   Influenza,inj,quad, With Preservative 12/01/2018   PFIZER(Purple Top)SARS-COV-2 Vaccination 10/03/2019, 10/24/2019, 04/13/2020     Objective: Vital Signs: BP 131/82 (BP Location: Right Arm, Patient Position: Sitting, Cuff Size: Normal)    Pulse 80    Resp 14    Ht 5' 3.5" (1.613 m)    Wt 182 lb (82.6 kg)    BMI 31.73 kg/m    Physical Exam HENT:     Mouth/Throat:     Mouth: Mucous membranes are moist.     Pharynx: Oropharynx is clear.  Eyes:     Conjunctiva/sclera: Conjunctivae normal.  Cardiovascular:     Rate and Rhythm: Normal rate and regular rhythm.  Pulmonary:     Effort: Pulmonary effort is normal.     Breath sounds: Normal breath sounds.     Comments: Chest pain with deep inspiration Musculoskeletal:     Right lower leg: No edema.     Left lower leg: No edema.  Skin:    General: Skin is warm and dry.  Neurological:     General: No focal deficit present.     Mental Status: She is alert.  Psychiatric:        Mood and Affect: Mood normal.     Musculoskeletal Exam:  Neck full ROM no tenderness Shoulders full ROM no tenderness or swelling Elbows full ROM no tenderness or swelling Wrists full ROM no tenderness or swelling Fingers full ROM no tenderness or swelling No paraspinal tenderness to palpation over upper and lower back Right side chest wall focal tenderness to pressure worst around the 6th-8th ribs midaxillary line Hip normal internal and external rotation without pain, restricted ROM with  FADIR without pain, left lateral hip tenderness to palpation Knees full ROM no tenderness or swelling Ankles full ROM no tenderness or swelling  Investigation: No additional findings.  Imaging: No results found.  Recent Labs: Lab Results  Component Value Date   WBC 7.2 12/20/2020   HGB 13.5 12/20/2020   PLT 272 03/12/2007   NA 138 12/20/2020   K 3.9 12/20/2020   CL 102 12/20/2020   CO2 21 12/20/2020   GLUCOSE 89 12/20/2020   BUN 12 12/20/2020   CREATININE 0.69 12/20/2020   BILITOT 0.3 12/20/2020   ALKPHOS 65 12/20/2020   AST 26 12/20/2020   ALT 17 12/20/2020   PROT 7.0 12/20/2020   ALBUMIN 4.7 12/20/2020   CALCIUM 9.6 12/20/2020   GFRAA  03/12/2007    >60        The eGFR has been calculated using the MDRD equation. This calculation has  not been validated in all clinical    Speciality Comments: No specialty comments available.  Procedures:  No procedures performed Allergies: Chlorhexidine   Assessment / Plan:     Visit Diagnoses: Arthralgia, unspecified joint - Plan: Rheumatoid factor, Sedimentation rate, ANA  Joint pains and a few areas mostly appears noninflammatory unremarkable MRI lab or exam findings so far.  Could be a problem mostly myofascial type pain.  However due to at least 2 years of extensive evaluation including multiple MRI imaging we will check initial autoimmune disease work-up looking for underlying causes.  Rib pain on right side  Focal symptoms with right rib pain suspect this is an intercostal muscle strain due to provoking movements location of tenderness and symptoms.  Recommended conservative treatment with daily low-dose NSAIDs provided information about this to review.  Trochanteric bursitis of left hip  Left hip focal tenderness appears consistent with previously diagnosed trochanteric bursitis consistent with past MRI findings.  Primary insomnia Other fatigue  Chronic insomnia problem without any recent change.  She does not  describe excessive daytime somnolence, but almost certainly contributing to fatigue.  Pain in joint of right shoulder  Not an active complaint at this time.   Orders: Orders Placed This Encounter  Procedures   Rheumatoid factor   Sedimentation rate   ANA   No orders of the defined types were placed in this encounter.    Follow-Up Instructions: No follow-ups on file.   Collier Salina, MD  Note - This record has been created using [employer].  Chart creation errors have been sought, but may not always  have been located. Such creation errors do not reflect on  the standard of medical care.

## 2021-03-14 ENCOUNTER — Other Ambulatory Visit: Payer: Self-pay

## 2021-03-14 ENCOUNTER — Ambulatory Visit: Payer: Managed Care, Other (non HMO) | Admitting: Internal Medicine

## 2021-03-14 ENCOUNTER — Encounter: Payer: Self-pay | Admitting: Internal Medicine

## 2021-03-14 VITALS — BP 131/82 | HR 80 | Resp 14 | Ht 63.5 in | Wt 182.0 lb

## 2021-03-14 DIAGNOSIS — R0789 Other chest pain: Secondary | ICD-10-CM | POA: Insufficient documentation

## 2021-03-14 DIAGNOSIS — M25511 Pain in right shoulder: Secondary | ICD-10-CM

## 2021-03-14 DIAGNOSIS — F5101 Primary insomnia: Secondary | ICD-10-CM | POA: Diagnosis not present

## 2021-03-14 DIAGNOSIS — M7062 Trochanteric bursitis, left hip: Secondary | ICD-10-CM

## 2021-03-14 DIAGNOSIS — M2550 Pain in unspecified joint: Secondary | ICD-10-CM

## 2021-03-14 DIAGNOSIS — R0781 Pleurodynia: Secondary | ICD-10-CM

## 2021-03-14 DIAGNOSIS — R5383 Other fatigue: Secondary | ICD-10-CM

## 2021-03-14 NOTE — Patient Instructions (Addendum)
Your rib pain appears most consistent with intercostal muscle strain. This is typically a self limited injury that will heal without specific intervention after several weeks. Taking an oral antiinflammatory medication such as Aleve/Naprosyn (naproxen) daily with food for 1 or 2 weeks can often help symptoms in the meantime.  I am checking for markers of inflammatory arthritis causes today especially rheumatoid arthritis or lupus.  Your TSH was checked at your primary care clinic in October and looked fine.

## 2021-03-17 LAB — RHEUMATOID FACTOR: Rhuematoid fact SerPl-aCnc: <14 IU/mL (ref <14)

## 2021-03-17 LAB — SEDIMENTATION RATE: Sed Rate: 2 mm/h (ref 0–20)

## 2021-03-17 LAB — ANA: Anti Nuclear Antibody (ANA): NEGATIVE

## 2021-03-17 NOTE — Progress Notes (Signed)
Lab tests are negative for markers with rheumatoid arthritis or lupus also normal inflammation markers. Based on this I don't suspect an inflammatory problem is causing current symptoms and don't need a scheduled follow up or new medication at this time.

## 2021-03-20 ENCOUNTER — Encounter (HOSPITAL_BASED_OUTPATIENT_CLINIC_OR_DEPARTMENT_OTHER): Payer: Self-pay | Admitting: Nurse Practitioner

## 2021-05-21 ENCOUNTER — Encounter (HOSPITAL_BASED_OUTPATIENT_CLINIC_OR_DEPARTMENT_OTHER): Payer: Self-pay | Admitting: Nurse Practitioner

## 2021-05-21 DIAGNOSIS — M2550 Pain in unspecified joint: Secondary | ICD-10-CM

## 2021-08-20 ENCOUNTER — Ambulatory Visit (INDEPENDENT_AMBULATORY_CARE_PROVIDER_SITE_OTHER): Payer: Commercial Managed Care - HMO | Admitting: Nurse Practitioner

## 2021-08-20 ENCOUNTER — Encounter (HOSPITAL_BASED_OUTPATIENT_CLINIC_OR_DEPARTMENT_OTHER): Payer: Self-pay | Admitting: Nurse Practitioner

## 2021-08-20 VITALS — BP 122/82 | HR 87 | Ht 64.0 in | Wt 182.5 lb

## 2021-08-20 DIAGNOSIS — J0140 Acute pansinusitis, unspecified: Secondary | ICD-10-CM

## 2021-08-20 DIAGNOSIS — R0789 Other chest pain: Secondary | ICD-10-CM | POA: Diagnosis not present

## 2021-08-20 DIAGNOSIS — R0981 Nasal congestion: Secondary | ICD-10-CM

## 2021-08-20 MED ORDER — AZITHROMYCIN 250 MG PO TABS: ORAL_TABLET | ORAL | 0 refills | Status: AC

## 2021-08-20 NOTE — Patient Instructions (Signed)
I have sent in augmentin for suspected infection. If this does not get any better please let me know. If this comes back or gets worse in any way please let me know or seek emergency care if you feel that it is severe.

## 2021-08-20 NOTE — Progress Notes (Signed)
Orma Render, DNP, AGNP-c [facility] [address]  [place], [place] [address] [phone]  Subjective:   Jade Raymond is a 31 y.o. female presents to day for upper respiratory symptoms.   Katherleen endorses congestion and sore throat that started over the weekend.  She tells me that the sore throat is improved however the congestion is getting worse day by day.  At this time she is not having any fevers or chills or body aches.  She has been taking over-the-counter medicine to help with her symptoms.  She is positive for cough but denies any mucus.  Chest Pain She endorses an episode of chest pain that started approximately 20-30 minutes into her walking on a treadmill today.  Chest pain was not significantly long-lasting but it was uncomfortable.  She endorses sweatiness during this episode however she was working.  She denies nausea, vomiting, jaw pain, back pain, dizziness, chest pressure.  She does tell me that she has had episodes of costochondritis in the past however this feels a little bit different in some ways but similar in others.  She does have a flare of the costochondritis about once a year.    Dr. Nelva Bush sent her to [facility] rheumatology- she tells me she is having issues in her hands She is having difficulty holding items in her hands.  She is awaiting further evaluation on this.   PMH, Medications, and Allergies reviewed and updated in chart.   ROS negative except for what is listed in HPI. Objective:  BP 122/82   Pulse 87   Ht '5\' 4"'$  (1.626 m)   Wt 182 lb 8 oz (82.8 kg)   SpO2 98%   BMI 31.33 kg/m  Physical Exam Vitals and nursing note reviewed.  Constitutional:      General: She is not in acute distress.    Appearance: Normal appearance. She is not ill-appearing or diaphoretic.  HENT:     Head: Normocephalic.     Right Ear: A middle ear effusion is present.     Left Ear: A middle ear effusion is present.      Nose: Congestion and rhinorrhea present.     Right Turbinates: Enlarged and swollen.     Left Turbinates: Enlarged and swollen.     Right Sinus: Maxillary sinus tenderness present.     Left Sinus: Maxillary sinus tenderness present.     Mouth/Throat:     Mouth: Mucous membranes are moist.     Pharynx: Oropharynx is clear. Posterior oropharyngeal erythema present. No oropharyngeal exudate.     Tonsils: No tonsillar exudate. 2+ on the right. 2+ on the left.  Eyes:     Extraocular Movements: Extraocular movements intact.     Pupils: Pupils are equal, round, and reactive to light.  Cardiovascular:     Rate and Rhythm: Normal rate and regular rhythm.     Pulses: Normal pulses.     Heart sounds: Normal heart sounds. No murmur heard.    No friction rub. No gallop.  Pulmonary:     Effort: Pulmonary effort is normal. No respiratory distress.     Breath sounds: Normal breath sounds. No wheezing or rhonchi.  Chest:     Chest wall: Tenderness present.  Abdominal:     General: Bowel sounds are normal. There is no distension.     Palpations: Abdomen is soft.     Tenderness: There is no abdominal tenderness.  Musculoskeletal:  General: Normal range of motion.     Right lower leg: No edema.     Left lower leg: No edema.  Lymphadenopathy:     Cervical: Cervical adenopathy present.  Skin:    General: Skin is warm and dry.     Capillary Refill: Capillary refill takes less than 2 seconds.  Neurological:     General: No focal deficit present.     Mental Status: She is alert and oriented to person, place, and time.     Motor: No weakness.  Psychiatric:        Mood and Affect: Mood normal.        Behavior: Behavior normal.        Thought Content: Thought content normal.        Judgment: Judgment normal.           Assessment & Plan:   Problem List Items Addressed This Visit     Left-sided chest wall pain - Primary    Reproducible tenderness to the left chest wall approximately 3  to 4 inches inferior to the clavicle at the midclavicular line.  Lungs are clear to auscultation with no evidence of rub present.  Heart rate and rhythm are regular at this time.  On evaluation this does not appear to be cardiac in nature.  Strongly suspect possible costochondritis or costochondral pain related to congestion and cough.  Encourage patient to monitor closely and seek care immediately if symptoms return or worsen at any point.  Will obtain labs today.  Plan follow-up as needed.      Relevant Orders   Comprehensive metabolic panel (Completed)   Magnesium (Completed)   Sinusitis, acute    Symptoms and presentation consistent with sinusitis.  We will go ahead and send prescription for azithromycin in for patient to have on hand if symptoms do not begin to improve over the next couple of days.  Okay to continue with over-the-counter treatments.  Follow-up if symptoms worsen or fail to improve.        Orma Render, DNP, AGNP-c 08/27/2021  7:00 PM

## 2021-08-21 LAB — COMPREHENSIVE METABOLIC PANEL
ALT: 26 IU/L (ref 0–32)
Albumin: 4.7 g/dL (ref 3.8–4.8)
BUN/Creatinine Ratio: 14 (ref 9–23)
Calcium: 9.6 mg/dL (ref 8.7–10.2)
Creatinine, Ser: 0.77 mg/dL (ref 0.57–1.00)
Sodium: 142 mmol/L (ref 134–144)
eGFR: 104 mL/min/{1.73_m2} (ref >59)

## 2021-08-21 LAB — MAGNESIUM: Magnesium: 2.3 mg/dL (ref 1.6–2.3)

## 2021-08-27 DIAGNOSIS — J019 Acute sinusitis, unspecified: Secondary | ICD-10-CM | POA: Insufficient documentation

## 2021-08-27 NOTE — Assessment & Plan Note (Signed)
Symptoms and presentation consistent with sinusitis.  We will go ahead and send prescription for azithromycin in for patient to have on hand if symptoms do not begin to improve over the next couple of days.  Okay to continue with over-the-counter treatments.  Follow-up if symptoms worsen or fail to improve.

## 2021-08-27 NOTE — Assessment & Plan Note (Signed)
Reproducible tenderness to the left chest wall approximately 3 to 4 inches inferior to the clavicle at the midclavicular line.  Lungs are clear to auscultation with no evidence of rub present.  Heart rate and rhythm are regular at this time.  On evaluation this does not appear to be cardiac in nature.  Strongly suspect possible costochondritis or costochondral pain related to congestion and cough.  Encourage patient to monitor closely and seek care immediately if symptoms return or worsen at any point.  Will obtain labs today.  Plan follow-up as needed.

## 2021-10-15 DIAGNOSIS — M25551 Pain in right hip: Secondary | ICD-10-CM | POA: Insufficient documentation

## 2021-12-26 ENCOUNTER — Ambulatory Visit (HOSPITAL_BASED_OUTPATIENT_CLINIC_OR_DEPARTMENT_OTHER): Payer: Commercial Managed Care - HMO | Admitting: Nurse Practitioner

## 2021-12-26 ENCOUNTER — Encounter (HOSPITAL_BASED_OUTPATIENT_CLINIC_OR_DEPARTMENT_OTHER): Payer: Self-pay | Admitting: Nurse Practitioner

## 2021-12-26 VITALS — BP 125/92 | HR 79 | Ht 64.0 in | Wt 177.0 lb

## 2021-12-26 DIAGNOSIS — Z0000 Encounter for general adult medical examination without abnormal findings: Secondary | ICD-10-CM

## 2021-12-26 DIAGNOSIS — Z23 Encounter for immunization: Secondary | ICD-10-CM

## 2021-12-26 DIAGNOSIS — M2550 Pain in unspecified joint: Secondary | ICD-10-CM | POA: Diagnosis not present

## 2021-12-26 NOTE — Patient Instructions (Signed)
We will check the labs and see if there is any indicator that could be causing this pain you are having. I will be in touch with you once we get the results.

## 2021-12-26 NOTE — Progress Notes (Signed)
BP (!) 125/92   Pulse 79   Ht _0  (1.626 m)   Wt 177 lb (80.3 kg)   SpO2 99%   BMI 30.38 kg/m    Subjective:    Patient ID: Jade Raymond, female    DOB: May 30, 1990, 31 y.o.   MRN: [id number]  HPI: Jade Raymond is a 31 y.o. female presenting on 12/26/2021 for comprehensive medical examination.   Current medical concerns include: Joint Pain throughout the body- pinpoint pain areas that hurt for several months at a time. Has seen rheumatology twice and has had extensive testing. Dry needling, PT not helpful. Would like to have hormones tested.   She reports regular vision exams q1-5y: no She reports regular dental exams q 37m yes Her diet consists of:  no special diet She endorses exercise and/or activity of:  daily- 5 miles of active walking She works in:  office setting  She endorses ETOH use (social) She denies nictoine use  She denies illegal substance use   She is having menstrual periods.  She  denies abnormal bleeding. She denies concerns today about STI: testing ordered: no  She denies concerns about skin changes today  She denies concerns about bowel changes today  She denies concerns about bladder changes today   Most Recent Depression Screen:     12/26/2021    8:40 AM 08/20/2021   11:00 AM 12/20/2020    8:43 AM 04/17/2015    9:58 AM  Depression screen PHQ 2/9  Decreased Interest 0 0 0 0  Down, Depressed, Hopeless 0 0 0 0  PHQ - 2 Score 0 0 0 0  Altered sleeping   3   Tired, decreased energy   0   Change in appetite   0   Feeling bad or failure about yourself    0   Trouble concentrating   0   Moving slowly or fidgety/restless   0   Suicidal thoughts   0   PHQ-9 Score   3   Difficult doing work/chores   Not difficult at all    Most Recent Anxiety Screen:     12/20/2020    8:43 AM  GAD 7 : Generalized Anxiety Score  Nervous, Anxious, on Edge 0  Control/stop worrying 0  Worry too much - different things 0  Trouble relaxing 0  Restless 0   Easily annoyed or irritable 0  Afraid - awful might happen 0  Total GAD 7 Score 0   Most Recent Fall Screen:    12/26/2021    8:35 AM 08/20/2021   11:00 AM 12/20/2020    8:42 AM  Fall Risk   Falls in the past year? 0 0 0  Number falls in past yr:  0 0  Injury with Fall? 0 0 0  Risk for fall due to : No Fall Risks No Fall Risks No Fall Risks  Follow up Falls evaluation completed Falls evaluation completed;Education provided Falls evaluation completed    All ROS negative except what is listed above and in the HPI.   Past medical history, surgical history, medications, allergies, family history and social history reviewed with patient today and changes made to appropriate areas of the chart.  Past Medical History:  Past Medical History:  Diagnosis Date   Allergy    Costochondritis    Medications:  Current Outpatient Medications on File Prior to Visit  Medication Sig   benzoyl peroxide (PANOXYL CREAMY WASH) 4 % external liquid 1 application  cetirizine (ZYRTEC) 10 MG tablet Zyrtec   levonorgestrel-ethinyl estradiol (NORDETTE) 0.15-30 MG-MCG tablet Altavera (28) 0.15 mg-0.03 mg tablet   No current facility-administered medications on file prior to visit.   Surgical History:  Past Surgical History:  Procedure Laterality Date   APPENDECTOMY     CHOLECYSTECTOMY     KNEE SURGERY Left    WISDOM TOOTH EXTRACTION     Allergies:  Allergies  Allergen Reactions   Chlorhexidine Hives, Itching and Rash    Other reaction(s): Unknown   Social History:  Social History   Socioeconomic History   Marital status: Single    Spouse name: Not on file   Number of children: Not on file   Years of education: Not on file   Highest education level: Not on file  Occupational History    Comment: Paralegal  Tobacco Use   Smoking status: Never   Smokeless tobacco: Never  Vaping Use   Vaping Use: Never used  Substance and Sexual Activity   Alcohol use: Yes    Alcohol/week: 2.0  standard drinks of alcohol    Types: 2 Cans of beer per week   Drug use: No   Sexual activity: Yes  Other Topics Concern   Not on file  Social History Narrative   ** Merged History Encounter **       Social Determinants of Health   Financial Resource Strain: Low Risk  (11/24/2017)   Overall Financial Resource Strain (CARDIA)    Difficulty of Paying Living Expenses: Not hard at all  Food Insecurity: No Food Insecurity (11/24/2017)   Hunger Vital Sign    Worried About Running Out of Food in the Last Year: Never true    [place] in the Last Year: Never true  Transportation Needs: Unknown (11/24/2017)   PRAPARE - Hydrologist (Medical): No    Lack of Transportation (Non-Medical): Not on file  Physical Activity: Not on file  Stress: No Stress Concern Present (11/24/2017)   [name]    Feeling of Stress : Only a little  Social Connections: Not on file  Intimate Partner Violence: Not At Risk (11/24/2017)   Humiliation, Afraid, Rape, and Kick questionnaire    Fear of Current or Ex-Partner: No    Emotionally Abused: No    Physically Abused: No    Sexually Abused: No   Social History   Tobacco Use  Smoking Status Never  Smokeless Tobacco Never   Social History   Substance and Sexual Activity  Alcohol Use Yes   Alcohol/week: 2.0 standard drinks of alcohol   Types: 2 Cans of beer per week   Family History:  Family History  Problem Relation Age of Onset   Asthma Mother    Cancer Maternal Uncle    Stroke Maternal Grandmother    Cancer Maternal Grandmother    Heart attack Maternal Grandfather    Deep vein thrombosis Maternal Grandfather    Thyroid disease Maternal Grandfather    Deep vein thrombosis Paternal Grandmother    Diabetes Paternal Grandmother    Heart attack Paternal Grandfather        Objective:    BP (!) 125/92   Pulse 79   Ht _0  (1.626 m)   Wt 177 lb  (80.3 kg)   SpO2 99%   BMI 30.38 kg/m   Wt Readings from Last 3 Encounters:  12/26/21 177 lb (80.3 kg)  08/20/21 182 lb 8  oz (82.8 kg)  03/14/21 182 lb (82.6 kg)    Physical Exam  Results for orders placed or performed in visit on 12/26/21  CBC with Differential/Platelet  Result Value Ref Range   WBC 5.1 3.4 - 10.8 x10E3/uL   RBC 4.06 3.77 - 5.28 x10E6/uL   Hemoglobin 12.3 11.1 - 15.9 g/dL   Hematocrit 37.3 34.0 - 46.6 %   MCV 92 79 - 97 fL   MCH 30.3 26.6 - 33.0 pg   MCHC 33.0 31.5 - 35.7 g/dL   RDW 11.3 (L) 11.7 - 15.4 %   Platelets 247 150 - 450 x10E3/uL   Neutrophils 63 Not Estab. %   Lymphs 28 Not Estab. %   Monocytes 6 Not Estab. %   Eos 2 Not Estab. %   Basos 1 Not Estab. %   Neutrophils Absolute 3.3 1.4 - 7.0 x10E3/uL   Lymphocytes Absolute 1.4 0.7 - 3.1 x10E3/uL   Monocytes Absolute 0.3 0.1 - 0.9 x10E3/uL   EOS (ABSOLUTE) 0.1 0.0 - 0.4 x10E3/uL   Basophils Absolute 0.0 0.0 - 0.2 x10E3/uL   Immature Granulocytes 0 Not Estab. %   Immature Grans (Abs) 0.0 0.0 - 0.1 x10E3/uL  Comprehensive metabolic panel  Result Value Ref Range   Glucose 97 70 - 99 mg/dL   BUN 19 6 - 20 mg/dL   Creatinine, Ser 0.74 0.57 - 1.00 mg/dL   eGFR 109 >59 mL/min/1.73   BUN/Creatinine Ratio 26 (H) 9 - 23   Sodium 138 134 - 144 mmol/L   Potassium 4.4 3.5 - 5.2 mmol/L   Chloride 104 96 - 106 mmol/L   CO2 20 20 - 29 mmol/L   Calcium 9.1 8.7 - 10.2 mg/dL   Total Protein 6.6 6.0 - 8.5 g/dL   Albumin 4.4 3.9 - 4.9 g/dL   Globulin, Total 2.2 1.5 - 4.5 g/dL   Albumin/Globulin Ratio 2.0 1.2 - 2.2   Bilirubin Total 0.3 0.0 - 1.2 mg/dL   Alkaline Phosphatase 68 44 - 121 IU/L   AST 22 0 - 40 IU/L   ALT 17 0 - 32 IU/L  Lipid panel  Result Value Ref Range   Cholesterol, Total 178 100 - 199 mg/dL   Triglycerides 48 0 - 149 mg/dL   HDL 58 >39 mg/dL   VLDL Cholesterol Cal 10 5 - 40 mg/dL   LDL Chol Calc (NIH) 110 (H) 0 - 99 mg/dL   Chol/HDL Ratio 3.1 0.0 - 4.4 ratio  Hemoglobin A1c  Result  Value Ref Range   Hgb A1c MFr Bld 5.1 4.8 - 5.6 %   Est. average glucose Bld gHb Est-mCnc 100 mg/dL  TSH  Result Value Ref Range   TSH 0.974 0.450 - 4.500 uIU/mL  T4, free  Result Value Ref Range   Free T4 1.52 0.82 - 1.77 ng/dL  VITAMIN D 25 Hydroxy (Vit-D Deficiency, Fractures)  Result Value Ref Range   Vit D, 25-Hydroxy 47.2 30.0 - 100.0 ng/mL  B12 and Folate Panel  Result Value Ref Range   Vitamin B-12 401 232 - 1,245 pg/mL   Folate >20.0 >3.0 ng/mL  Testosterone  Result Value Ref Range   Testosterone 5 (L) 8 - 60 ng/dL  Estradiol  Result Value Ref Range   Estradiol <5.0 pg/mL  Magnesium  Result Value Ref Range   Magnesium 2.3 1.6 - 2.3 mg/dL  Zinc  Result Value Ref Range   Zinc 73 44 - 115 ug/dL    MMUNIZATIONS:   - Tdap:  Tetanus vaccination status reviewed: need records. - Influenza:  utd - Pneumovax: Not applicable - Prevnar: Not applicable - HPV: Not applicable - Zostavax vaccine: Not applicable  SCREENING COMPLETED: - Pap smear:  utd - STI testing:Not Applicable -Mammogram: Not Applicable - Colonoscopy: {Not Applicable - Bone Density: {Not Applicable -Hearing Test: Not Applicable -Spirometry: Not Applicable     Assessment & Plan:   Problem List Items Addressed This Visit     Encounter for annual physical exam - Primary    CPE today with no abnormalities noted on exam.  Labs pending. Will make changes as necessary based on results.  Review of HM activities and recommendations discussed and provided on AVS Anticipatory guidance, diet, and exercise recommendations provided.  Medications, allergies, and hx reviewed and updated as necessary.  Plan to f/u with CPE in 1 year or sooner for acute/chronic health needs as directed.        Arthralgia    Chronic multi-joint arthralgia of unknown etiology. At this time, we will check labs to see if this is possibly related to an imbalance or autoimmune component. Given that she is on hormone therapy with OCP,  testing would likely not be an accurate indication of her natural hormone levels. Will make changes to plan of care as necessary based on lab findings.       Relevant Orders   CBC with Differential/Platelet (Completed)   Comprehensive metabolic panel (Completed)   Lipid panel (Completed)   Hemoglobin A1c (Completed)   TSH (Completed)   T4, free (Completed)   VITAMIN D 25 Hydroxy (Vit-D Deficiency, Fractures) (Completed)   B12 and Folate Panel (Completed)   Testosterone (Completed)   Estradiol (Completed)   Lyme Disease Serology w/Reflex   Magnesium (Completed)   Zinc (Completed)   Other Visit Diagnoses     Routine health maintenance       Relevant Orders   CBC with Differential/Platelet (Completed)   Comprehensive metabolic panel (Completed)   Lipid panel (Completed)   Hemoglobin A1c (Completed)   TSH (Completed)   T4, free (Completed)   VITAMIN D 25 Hydroxy (Vit-D Deficiency, Fractures) (Completed)   B12 and Folate Panel (Completed)   Testosterone (Completed)   Estradiol (Completed)   Flu vaccine need       Relevant Orders   Flu Vaccine QUAD 6+ mos PF IM (Fluarix Quad PF) (Completed)          Follow up plan: Return in about 1 year (around 12/27/2022) for CPE.  NEXT PREVENTATIVE PHYSICAL DUE IN 1 YEAR.  PATIENT COUNSELING PROVIDED FOR ALL ADULT PATIENTS:  Consume a well balanced diet low in saturated fats, cholesterol, and moderation in carbohydrates.   This can be as simple as monitoring portion sizes and cutting back on sugary beverages such as soda and juice to start with.    Daily water consumption of at least 64 ounces.  Physical activity at least 180 minutes per week, if just starting out.   This can be as simple as taking the stairs instead of the elevator and walking 2-3 laps around the office  purposefully every day.   STD protection, partner selection, and regular testing if high risk.  Limited consumption of alcoholic beverages if alcohol is  consumed.  For women, I recommend no more than 7 alcoholic beverages per week, spread out throughout the week.  Avoid "binge" drinking or consuming large quantities of alcohol in one setting.   Please let me know if you feel you may need help with  reduction or quitting alcohol consumption.   Avoidance of nicotine, if used.  Please let me know if you feel you may need help with reduction or quitting nicotine use.   Daily mental health attention.  This can be in the form of 5 minute daily meditation, prayer, journaling, yoga, reflection, etc.   Purposeful attention to your emotions and mental state can significantly improve your overall wellbeing and Health.  Please know that I am here to help you with all of your health care goals and am happy to work with you to find a solution that works best for you.  The greatest advice I have received with any changes in life are to take it one step at a time, that even means if all you can focus on is the next 60 seconds, then do that and celebrate your victories.  With any changes in life, you will have set backs, and that is OK. The important thing to remember is, if you have a set back, it is not a failure, it is an opportunity to try again!  Health Maintenance Recommendations Screening Testing Mammogram Every 1 -2 years based on history and risk factors Starting at age 27 Pap Smear Ages 21-39 every 3 years Ages 49-65 every 5 years with HPV testing More frequent testing may be required based on results and history Colon Cancer Screening Every 1-10 years based on test performed, risk factors, and history Starting at age 47 Bone Density Screening Every 2-10 years based on history Starting at age 70 for women Recommendations for men differ based on medication usage, history, and risk factors AAA Screening One time ultrasound Men 57-15 years old who have every smoked Lung Cancer Screening Low Dose Lung CT every 12 months Age 46-80 years with  a 30 pack-year smoking history who still smoke or who have quit within the last 15 years  Screening Labs Routine  Labs: Complete Blood Count (CBC), Complete Metabolic Panel (CMP), Cholesterol (Lipid Panel) Every 6-12 months based on history and medications May be recommended more frequently based on current conditions or previous results Hemoglobin A1c Lab Every 3-12 months based on history and previous results Starting at age 75 or earlier with diagnosis of diabetes, high cholesterol, BMI >26, and/or risk factors Frequent monitoring for patients with diabetes to ensure blood sugar control Thyroid Panel (TSH w/ T3 & T4) Every 6 months based on history, symptoms, and risk factors May be repeated more often if on medication HIV One time testing for all patients 42 and older May be repeated more frequently for patients with increased risk factors or exposure Hepatitis C One time testing for all patients 8 and older May be repeated more frequently for patients with increased risk factors or exposure Gonorrhea, Chlamydia Every 12 months for all sexually active persons 13-24 years Additional monitoring may be recommended for those who are considered high risk or who have symptoms PSA Men 18-21 years old with risk factors Additional screening may be recommended from age 63-69 based on risk factors, symptoms, and history  Vaccine Recommendations Tetanus Booster All adults every 10 years Flu Vaccine All patients 6 months and older every year COVID Vaccine All patients 12 years and older Initial dosing with booster May recommend additional booster based on age and health history HPV Vaccine 2 doses all patients age 2-26 Dosing may be considered for patients over 26 Shingles Vaccine (Shingrix) 2 doses all adults 6 years and older Pneumonia (Pneumovax 30) All adults 58 years and older  May recommend earlier dosing based on health history Pneumonia (Prevnar 13) All adults 70 years and  older Dosed 1 year after Pneumovax 23  Additional Screening, Testing, and Vaccinations may be recommended on an individualized basis based on family history, health history, risk factors, and/or exposure.

## 2022-01-06 LAB — COMPREHENSIVE METABOLIC PANEL
ALT: 17 IU/L (ref 0–32)
AST: 22 IU/L (ref 0–40)
Albumin/Globulin Ratio: 2 (ref 1.2–2.2)
Albumin: 4.4 g/dL (ref 3.9–4.9)
Alkaline Phosphatase: 68 IU/L (ref 44–121)
BUN/Creatinine Ratio: 26 — ABNORMAL HIGH (ref 9–23)
BUN: 19 mg/dL (ref 6–20)
Bilirubin Total: 0.3 mg/dL (ref 0.0–1.2)
CO2: 20 mmol/L (ref 20–29)
Calcium: 9.1 mg/dL (ref 8.7–10.2)
Chloride: 104 mmol/L (ref 96–106)
Creatinine, Ser: 0.74 mg/dL (ref 0.57–1.00)
Globulin, Total: 2.2 g/dL (ref 1.5–4.5)
Glucose: 97 mg/dL (ref 70–99)
Potassium: 4.4 mmol/L (ref 3.5–5.2)
Sodium: 138 mmol/L (ref 134–144)
Total Protein: 6.6 g/dL (ref 6.0–8.5)
eGFR: 109 mL/min/{1.73_m2} (ref >59)

## 2022-01-06 LAB — T4, FREE: Free T4: 1.52 ng/dL (ref 0.82–1.77)

## 2022-01-06 LAB — CBC WITH DIFFERENTIAL/PLATELET
Basophils Absolute: 0 10*3/uL (ref 0.0–0.2)
Basos: 1 %
EOS (ABSOLUTE): 0.1 10*3/uL (ref 0.0–0.4)
Eos: 2 %
Hematocrit: 37.3 % (ref 34.0–46.6)
Hemoglobin: 12.3 g/dL (ref 11.1–15.9)
Immature Grans (Abs): 0 10*3/uL (ref 0.0–0.1)
Immature Granulocytes: 0 %
Lymphocytes Absolute: 1.4 10*3/uL (ref 0.7–3.1)
Lymphs: 28 %
MCH: 30.3 pg (ref 26.6–33.0)
MCHC: 33 g/dL (ref 31.5–35.7)
MCV: 92 fL (ref 79–97)
Monocytes Absolute: 0.3 10*3/uL (ref 0.1–0.9)
Monocytes: 6 %
Neutrophils Absolute: 3.3 10*3/uL (ref 1.4–7.0)
Neutrophils: 63 %
Platelets: 247 10*3/uL (ref 150–450)
RBC: 4.06 x10E6/uL (ref 3.77–5.28)
RDW: 11.3 % — ABNORMAL LOW (ref 11.7–15.4)
WBC: 5.1 10*3/uL (ref 3.4–10.8)

## 2022-01-06 LAB — VITAMIN D 25 HYDROXY (VIT D DEFICIENCY, FRACTURES): Vit D, 25-Hydroxy: 47.2 ng/mL (ref 30.0–100.0)

## 2022-01-06 LAB — LIPID PANEL
Chol/HDL Ratio: 3.1 ratio (ref 0.0–4.4)
Cholesterol, Total: 178 mg/dL (ref 100–199)
HDL: 58 mg/dL (ref >39)
LDL Chol Calc (NIH): 110 mg/dL — ABNORMAL HIGH (ref 0–99)
Triglycerides: 48 mg/dL (ref 0–149)
VLDL Cholesterol Cal: 10 mg/dL (ref 5–40)

## 2022-01-06 LAB — ESTRADIOL: Estradiol: <5 pg/mL

## 2022-01-06 LAB — B12 AND FOLATE PANEL
Folate: >20 ng/mL (ref >3.0)
Vitamin B-12: 401 pg/mL (ref 232–1245)

## 2022-01-06 LAB — MAGNESIUM: Magnesium: 2.3 mg/dL (ref 1.6–2.3)

## 2022-01-06 LAB — ZINC: Zinc: 73 ug/dL (ref 44–115)

## 2022-01-06 LAB — TSH: TSH: 0.974 u[IU]/mL (ref 0.450–4.500)

## 2022-01-06 LAB — HEMOGLOBIN A1C
Est. average glucose Bld gHb Est-mCnc: 100 mg/dL
Hgb A1c MFr Bld: 5.1 % (ref 4.8–5.6)

## 2022-01-06 LAB — TESTOSTERONE: Testosterone: 5 ng/dL — ABNORMAL LOW (ref 8–60)

## 2022-01-08 ENCOUNTER — Encounter (HOSPITAL_BASED_OUTPATIENT_CLINIC_OR_DEPARTMENT_OTHER): Payer: Self-pay | Admitting: Nurse Practitioner

## 2022-01-08 DIAGNOSIS — E349 Endocrine disorder, unspecified: Secondary | ICD-10-CM

## 2022-02-03 DIAGNOSIS — E669 Obesity, unspecified: Secondary | ICD-10-CM | POA: Insufficient documentation

## 2022-02-03 NOTE — Assessment & Plan Note (Signed)

## 2022-02-03 NOTE — Assessment & Plan Note (Signed)
Chronic multi-joint arthralgia of unknown etiology. At this time, we will check labs to see if this is possibly related to an imbalance or autoimmune component. Given that she is on hormone therapy with OCP, testing would likely not be an accurate indication of her natural hormone levels. Will make changes to plan of care as necessary based on lab findings.

## 2022-03-30 DIAGNOSIS — F4320 Adjustment disorder, unspecified: Secondary | ICD-10-CM | POA: Diagnosis not present

## 2022-04-15 DIAGNOSIS — F4320 Adjustment disorder, unspecified: Secondary | ICD-10-CM | POA: Diagnosis not present

## 2022-04-17 DIAGNOSIS — Z3040 Encounter for surveillance of contraceptives, unspecified: Secondary | ICD-10-CM | POA: Diagnosis not present

## 2022-04-17 DIAGNOSIS — Z6832 Body mass index (BMI) 32.0-32.9, adult: Secondary | ICD-10-CM | POA: Diagnosis not present

## 2022-04-17 DIAGNOSIS — Z01419 Encounter for gynecological examination (general) (routine) without abnormal findings: Secondary | ICD-10-CM | POA: Diagnosis not present

## 2022-04-20 LAB — HM PAP SMEAR: HPV, high-risk: NEGATIVE

## 2022-04-29 DIAGNOSIS — F4320 Adjustment disorder, unspecified: Secondary | ICD-10-CM | POA: Diagnosis not present

## 2022-05-11 DIAGNOSIS — F4320 Adjustment disorder, unspecified: Secondary | ICD-10-CM | POA: Diagnosis not present

## 2022-05-25 DIAGNOSIS — F4320 Adjustment disorder, unspecified: Secondary | ICD-10-CM | POA: Diagnosis not present

## 2022-06-08 DIAGNOSIS — F4320 Adjustment disorder, unspecified: Secondary | ICD-10-CM | POA: Diagnosis not present

## 2022-06-25 DIAGNOSIS — F4320 Adjustment disorder, unspecified: Secondary | ICD-10-CM | POA: Diagnosis not present

## 2022-07-01 DIAGNOSIS — M25521 Pain in right elbow: Secondary | ICD-10-CM | POA: Diagnosis not present

## 2022-07-06 DIAGNOSIS — F4320 Adjustment disorder, unspecified: Secondary | ICD-10-CM | POA: Diagnosis not present

## 2022-07-13 DIAGNOSIS — F4320 Adjustment disorder, unspecified: Secondary | ICD-10-CM | POA: Diagnosis not present

## 2022-07-29 DIAGNOSIS — F4320 Adjustment disorder, unspecified: Secondary | ICD-10-CM | POA: Diagnosis not present

## 2022-08-06 DIAGNOSIS — F4320 Adjustment disorder, unspecified: Secondary | ICD-10-CM | POA: Diagnosis not present

## 2022-08-13 DIAGNOSIS — F4320 Adjustment disorder, unspecified: Secondary | ICD-10-CM | POA: Diagnosis not present

## 2022-08-24 DIAGNOSIS — F4323 Adjustment disorder with mixed anxiety and depressed mood: Secondary | ICD-10-CM | POA: Diagnosis not present

## 2022-09-07 DIAGNOSIS — F4320 Adjustment disorder, unspecified: Secondary | ICD-10-CM | POA: Diagnosis not present

## 2022-09-14 DIAGNOSIS — F4320 Adjustment disorder, unspecified: Secondary | ICD-10-CM | POA: Diagnosis not present

## 2022-09-21 DIAGNOSIS — F4320 Adjustment disorder, unspecified: Secondary | ICD-10-CM | POA: Diagnosis not present

## 2022-10-05 DIAGNOSIS — F4320 Adjustment disorder, unspecified: Secondary | ICD-10-CM | POA: Diagnosis not present

## 2022-10-12 DIAGNOSIS — F4320 Adjustment disorder, unspecified: Secondary | ICD-10-CM | POA: Diagnosis not present

## 2022-10-19 DIAGNOSIS — F4320 Adjustment disorder, unspecified: Secondary | ICD-10-CM | POA: Diagnosis not present

## 2022-11-05 DIAGNOSIS — F4320 Adjustment disorder, unspecified: Secondary | ICD-10-CM | POA: Diagnosis not present

## 2022-11-16 DIAGNOSIS — F4320 Adjustment disorder, unspecified: Secondary | ICD-10-CM | POA: Diagnosis not present

## 2022-11-23 DIAGNOSIS — F4320 Adjustment disorder, unspecified: Secondary | ICD-10-CM | POA: Diagnosis not present

## 2022-12-07 DIAGNOSIS — F4320 Adjustment disorder, unspecified: Secondary | ICD-10-CM | POA: Diagnosis not present

## 2022-12-09 ENCOUNTER — Ambulatory Visit (INDEPENDENT_AMBULATORY_CARE_PROVIDER_SITE_OTHER): Payer: 59 | Admitting: Family Medicine

## 2022-12-09 ENCOUNTER — Encounter (HOSPITAL_BASED_OUTPATIENT_CLINIC_OR_DEPARTMENT_OTHER): Payer: Self-pay | Admitting: Family Medicine

## 2022-12-09 VITALS — BP 138/94 | HR 81 | Ht 64.0 in | Wt 180.0 lb

## 2022-12-09 DIAGNOSIS — Z0000 Encounter for general adult medical examination without abnormal findings: Secondary | ICD-10-CM

## 2022-12-09 DIAGNOSIS — J00 Acute nasopharyngitis [common cold]: Secondary | ICD-10-CM

## 2022-12-09 MED ORDER — AZITHROMYCIN 250 MG PO TABS: ORAL_TABLET | ORAL | 0 refills | Status: AC

## 2022-12-09 NOTE — Progress Notes (Signed)
Complete physical exam  Patient: Jade Raymond   DOB: 1990-12-26   32 y.o. Female  MRN: [id number]  Subjective:    Chief Complaint  Patient presents with   Cough    Had a cold starting on Friday, congestion mainly and headache 2 negative covid test. Dry coughing started last night, can feel congestion in chest  Nyquil and Dayquil and Advil cold and flu    Jade Raymond is a 32 y.o. female who presents today for a complete physical exam. She reports consuming a general diet. Gym/ health club routine includes cardio. She generally feels well. She reports sleeping well. She does not have additional problems to discuss today.   HEALTH SCREENINGS: - Vision Screening: never been, no recent vision changes  - Dental Visits: Feb 2024, goes annually - Pap smear: up to date, 04/17/2022, normal  - Breast Exam: up to date - STD Screening: Declined - Mammogram (40+): Not applicable  - Colonoscopy (45+): Not applicable  - Bone Density (65+ or under 65 with predisposing conditions): Not applicable  - Lung CA screening with low-dose CT:  Not applicable  IMMUNIZATIONS: - Tdap: tetanus status unknown to the patient, declines today - HPV: Refused - Influenza: postponed  - Pneumovax: Not applicable - Prevnar 20: Not applicable - Zostavax (50+): Not applicable   Depression screenings:    12/26/2021    8:40 AM 08/20/2021   11:00 AM 12/20/2020    8:43 AM  Depression screen PHQ 2/9  Decreased Interest 0 0 0  Down, Depressed, Hopeless 0 0 0  PHQ - 2 Score 0 0 0  Altered sleeping   3  Tired, decreased energy   0  Change in appetite   0  Feeling bad or failure about yourself    0  Trouble concentrating   0  Moving slowly or fidgety/restless   0  Suicidal thoughts   0  PHQ-9 Score   3  Difficult doing work/chores   Not difficult at all    Anxiety screenings:    12/20/2020    8:43 AM  GAD 7 : Generalized Anxiety Score  Nervous, Anxious, on Edge 0  Control/stop worrying 0  Worry  too much - different things 0  Trouble relaxing 0  Restless 0  Easily annoyed or irritable 0  Afraid - awful might happen 0  Total GAD 7 Score 0   Patient Care Team: Alyson Reedy, FNP as PCP - General (Family Medicine) Patient, No Pcp Per (General Practice)   Outpatient Medications Prior to Visit  Medication Sig   benzoyl peroxide (PANOXYL CREAMY WASH) 4 % external liquid 1 application   cetirizine (ZYRTEC) 10 MG tablet Zyrtec   levonorgestrel-ethinyl estradiol (NORDETTE) 0.15-30 MG-MCG tablet Take 1 tablet by mouth daily.   [DISCONTINUED] levonorgestrel-ethinyl estradiol (NORDETTE) 0.15-30 MG-MCG tablet Altavera (28) 0.15 mg-0.03 mg tablet (Patient not taking: Reported on 12/09/2022)   No facility-administered medications prior to visit.   Review of Systems  Constitutional:  Negative for malaise/fatigue and weight loss.  HENT:  Negative for ear pain and tinnitus.   Eyes:  Negative for blurred vision and double vision.  Respiratory:  Negative for shortness of breath and wheezing.   Cardiovascular:  Negative for chest pain, palpitations and leg swelling.  Gastrointestinal:  Negative for abdominal pain, nausea and vomiting.  Genitourinary:  Negative for frequency and urgency.  Musculoskeletal:  Negative for myalgias.  Neurological:  Negative for dizziness, weakness and headaches.  Psychiatric/Behavioral:  Negative for depression, substance  abuse and suicidal ideas. The patient is not nervous/anxious and does not have insomnia.      Objective:     BP (!) 138/94   Pulse 81   Ht 5\' 4"  (1.626 m)   Wt 180 lb (81.6 kg)   LMP 12/08/2022 (Exact Date)   SpO2 96%   Breastfeeding No   BMI 30.90 kg/m  BP Readings from Last 3 Encounters:  12/09/22 (!) 138/94  12/26/21 (!) 125/92  08/20/21 122/82     Physical Exam Constitutional:      Appearance: Normal appearance.  HENT:     Head: Normocephalic.     Right Ear: Tympanic membrane, ear canal and external ear normal.     Left  Ear: Tympanic membrane, ear canal and external ear normal.     Nose: Congestion present.     Mouth/Throat:     Mouth: Mucous membranes are moist.     Pharynx: Oropharynx is clear.  Eyes:     Extraocular Movements: Extraocular movements intact.     Conjunctiva/sclera: Conjunctivae normal.     Pupils: Pupils are equal, round, and reactive to light.  Cardiovascular:     Rate and Rhythm: Normal rate and regular rhythm.     Pulses: Normal pulses.     Heart sounds: Normal heart sounds.  Pulmonary:     Effort: Pulmonary effort is normal.     Breath sounds: Normal breath sounds.  Abdominal:     General: Abdomen is flat. Bowel sounds are normal.     Palpations: Abdomen is soft.  Musculoskeletal:        General: Normal range of motion.     Cervical back: Normal range of motion and neck supple.  Skin:    General: Skin is warm and dry.  Neurological:     Mental Status: She is alert.  Psychiatric:        Mood and Affect: Mood normal.        Behavior: Behavior normal.        Thought Content: Thought content normal.        Judgment: Judgment normal.         Assessment & Plan:    Routine Health Maintenance and Physical Exam  Health Maintenance  Topic Date Due   DTaP/Tdap/Td vaccine (1 - Tdap) Never done   Pap with HPV screening  06/14/2018   Flu Shot  10/01/2022   COVID-19 Vaccine (4 - 2023-24 season) 11/01/2022   HPV Vaccine  Aged Out   Hepatitis C Screening  Discontinued   HIV Screening  Discontinued    1. Wellness examination Routine HCM labs ordered. Will obtain non-fasting labs today and update patient with results.  Review of PMH, FH, SH, medications and HM performed. Preventative care hand-out provided.  Recommend healthy diet.  Recommend approximately 150 minutes/week of moderate intensity exercise. Recommend regular dental and vision exams. Always use seatbelt/lap and shoulder restraints. Recommend using smoke alarms and checking batteries at least twice a  year. Recommend using sunscreen when outside. Discussed immunization recommendations for influenza and COVID immunizations. Patient declines at this time, plans to get influenza vaccine when she is feeling better.  - CBC with Differential/Platelet - Comprehensive metabolic panel - Hemoglobin A1c - Lipid panel - TSH Rfx on Abnormal to Free T4   Plan for annual physical exam in 1-year with fasting labs.     Alyson Reedy, FNP

## 2022-12-09 NOTE — Progress Notes (Signed)
   Acute Office Visit  Subjective:     Patient ID: Jade Raymond, female    DOB: Dec 23, 1990, 32 y.o.   MRN: [id number]  Chief Complaint  Patient presents with   Cough    Had a cold starting on Friday, congestion mainly and headache 2 negative covid test. Dry coughing started last night, can feel congestion in chest  Nyquil and Dayquil and Advil cold and flu   Jade Raymond is a 32 year old female who presents today for cough x5 days and congestion. She reports she had cold symptoms through Sunday- including headache, nasal congestion and cough. Last night, she had a dry cough and has felt it in her chest. Denies fever/chills, body aches, diarrhea, N/V, decrease in appetite.   Review of Systems  Constitutional:  Negative for chills, fever and malaise/fatigue.  HENT:  Positive for congestion. Negative for ear pain and sore throat.   Eyes:  Negative for blurred vision and double vision.  Respiratory:  Positive for cough. Negative for shortness of breath and wheezing.   Cardiovascular:  Negative for chest pain, palpitations and leg swelling.  Gastrointestinal:  Negative for abdominal pain, nausea and vomiting.  Neurological:  Negative for headaches.      Objective:    BP (!) 138/94   Pulse 81   Ht 5\' 4"  (1.626 m)   Wt 180 lb (81.6 kg)   LMP 12/08/2022 (Exact Date)   SpO2 96%   Breastfeeding No   BMI 30.90 kg/m   Physical Exam Constitutional:      Appearance: Normal appearance.  Cardiovascular:     Rate and Rhythm: Normal rate and regular rhythm.     Pulses: Normal pulses.     Heart sounds: Normal heart sounds.  Pulmonary:     Effort: Pulmonary effort is normal.     Breath sounds: Normal breath sounds.  Neurological:     Mental Status: She is alert.  Psychiatric:        Mood and Affect: Mood normal.        Behavior: Behavior normal.     Assessment & Plan:   1. Acute nasopharyngitis Vital signs stable. Patient in no acute distress and is well-appearing. Denies  chest pain, shortness of breath, lower extremity edema, vision changes, headaches. Reports nasal congestion and cough. Denies headache, sinus pressure/pain, sore throat. TM intact, non-tender with normal light reflex present. Nasal turbinates erythematous. Cardiovascular exam with heart regular rate and rhythm. Normal heart sounds, no murmurs present. No lower extremity edema present. Lungs clear to auscultation bilaterally- no adventitious lung sounds present. Discussed that there are less concerns for acute bacterial sinusitis. However, since patient is going out of town on Friday, will provide patient with azithromycin if she feels symptoms persist or worsen.  - azithromycin (ZITHROMAX) 250 MG tablet; Take 2 tablets on day 1, then 1 tablet daily on days 2 through 5  Dispense: 6 tablet; Refill: 0  Follow-up if symptoms persist or worsen.   Jade Reedy, FNP

## 2022-12-09 NOTE — Patient Instructions (Signed)
 MyChart:  For all urgent or time sensitive needs we ask that you please call the office to avoid delays. Our number is [phone]. MyChart is not constantly monitored and due to the large volume of messages a day, replies may take up to 72 business hours.   MyChart Policy: MyChart allows for you to see your visit notes, after visit summary, provider recommendations, lab and tests results, make an appointment, request refills, and contact your provider or the office for non-urgent questions or concerns. Providers are seeing patients during normal business hours and do not have built in time to review MyChart messages.  We ask that you allow a minimum of 3 business days for responses to [employer]. For this reason, please do not send urgent requests through MyChart. Please call the office at [phone]. New and ongoing conditions may require a visit. We have virtual and in person visit available for your convenience.  Complex MyChart concerns may require a visit. Your provider may request you schedule a virtual or in person visit to ensure we are providing the best care possible. MyChart messages sent after 11:00 AM on Friday will not be received by the provider until Monday morning.    Lab and Test Results: You will receive your lab and test results on MyChart as soon as they are completed and results have been sent by the lab or testing facility. Due to this service, you will receive your results BEFORE your provider.  I review lab and tests results each morning prior to seeing patients. Some results require collaboration with other providers to ensure you are receiving the most appropriate care. For this reason, we ask that you please allow a minimum of 3-5 business days from the time the ALL results have been received for your provider to receive and review lab and test results and contact you about these.  Most lab and test result comments from the provider will be sent through MyChart.  Your provider may recommend changes to the plan of care, follow-up visits, repeat testing, ask questions, or request an office visit to discuss these results. You may reply directly to this message or call the office at [phone] to provide information for the provider or set up an appointment. In some instances, you will be called with test results and recommendations. Please let us know if this is preferred and we will make note of this in your chart to provide this for you.    If you have not heard a response to your lab or test results in 5 business days from all results returning to MyChart, please call the office to let us know. We ask that you please avoid calling prior to this time unless there is an emergent concern. Due to high call volumes, this can delay the resulting process.   After Hours: For all non-emergency after hours needs, please call the office at [phone] and select the option to reach the on-call provider service. On-call services are shared between multiple [place] offices and therefore it will not be possible to speak directly with your provider. On-call providers may provide medical advice and recommendations, but are unable to provide refills for maintenance medications.  For all emergency or urgent medical needs after normal business hours, we recommend that you seek care at the closest Urgent Care or Emergency Department to ensure appropriate treatment in a timely manner.  [facility] has a 24 hour emergency room located on the ground floor for your  convenience.    Urgent Concerns During the Business Day Providers are seeing patients from 8AM to 5PM, Monday through Thursday, and 8AM to 12PM on Friday with a busy schedule and are most often not able to respond to non-urgent calls until the end of the day or the next business day. If you should have URGENT concerns during the day, please call and speak to the nurse or schedule a same day  appointment so that we can address your concern without delay.    Thank you, again, for choosing me as your health care partner. I appreciate your trust and look forward to learning more about you.    Jade Reedy, FNP-C

## 2022-12-10 LAB — LIPID PANEL
Chol/HDL Ratio: 3.9 {ratio} (ref 0.0–4.4)
Cholesterol, Total: 202 mg/dL — ABNORMAL HIGH (ref 100–199)
HDL: 52 mg/dL (ref >39)
LDL Chol Calc (NIH): 131 mg/dL — ABNORMAL HIGH (ref 0–99)
Triglycerides: 103 mg/dL (ref 0–149)
VLDL Cholesterol Cal: 19 mg/dL (ref 5–40)

## 2022-12-10 LAB — COMPREHENSIVE METABOLIC PANEL
ALT: 16 [IU]/L (ref 0–32)
AST: 24 [IU]/L (ref 0–40)
Albumin: 4.7 g/dL (ref 3.9–4.9)
Alkaline Phosphatase: 81 [IU]/L (ref 44–121)
BUN/Creatinine Ratio: 19 (ref 9–23)
BUN: 14 mg/dL (ref 6–20)
Bilirubin Total: 0.4 mg/dL (ref 0.0–1.2)
CO2: 21 mmol/L (ref 20–29)
Calcium: 9.5 mg/dL (ref 8.7–10.2)
Chloride: 104 mmol/L (ref 96–106)
Creatinine, Ser: 0.75 mg/dL (ref 0.57–1.00)
Globulin, Total: 2.7 g/dL (ref 1.5–4.5)
Glucose: 111 mg/dL — ABNORMAL HIGH (ref 70–99)
Potassium: 4.2 mmol/L (ref 3.5–5.2)
Sodium: 141 mmol/L (ref 134–144)
Total Protein: 7.4 g/dL (ref 6.0–8.5)
eGFR: 107 mL/min/{1.73_m2} (ref >59)

## 2022-12-10 LAB — CBC WITH DIFFERENTIAL/PLATELET
Basophils Absolute: 0 10*3/uL (ref 0.0–0.2)
Basos: 0 %
EOS (ABSOLUTE): 0.1 10*3/uL (ref 0.0–0.4)
Eos: 1 %
Hematocrit: 43.9 % (ref 34.0–46.6)
Hemoglobin: 14.3 g/dL (ref 11.1–15.9)
Immature Grans (Abs): 0 10*3/uL (ref 0.0–0.1)
Immature Granulocytes: 0 %
Lymphocytes Absolute: 1.9 10*3/uL (ref 0.7–3.1)
Lymphs: 28 %
MCH: 30.9 pg (ref 26.6–33.0)
MCHC: 32.6 g/dL (ref 31.5–35.7)
MCV: 95 fL (ref 79–97)
Monocytes Absolute: 0.5 10*3/uL (ref 0.1–0.9)
Monocytes: 7 %
Neutrophils Absolute: 4.4 10*3/uL (ref 1.4–7.0)
Neutrophils: 64 %
Platelets: 257 10*3/uL (ref 150–450)
RBC: 4.63 x10E6/uL (ref 3.77–5.28)
RDW: 11.7 % (ref 11.7–15.4)
WBC: 6.8 10*3/uL (ref 3.4–10.8)

## 2022-12-10 LAB — HEMOGLOBIN A1C
Est. average glucose Bld gHb Est-mCnc: 105 mg/dL
Hgb A1c MFr Bld: 5.3 % (ref 4.8–5.6)

## 2022-12-10 LAB — TSH RFX ON ABNORMAL TO FREE T4: TSH: 0.453 u[IU]/mL (ref 0.450–4.500)

## 2022-12-16 DIAGNOSIS — M47896 Other spondylosis, lumbar region: Secondary | ICD-10-CM | POA: Diagnosis not present

## 2022-12-21 DIAGNOSIS — F4320 Adjustment disorder, unspecified: Secondary | ICD-10-CM | POA: Diagnosis not present

## 2022-12-22 ENCOUNTER — Ambulatory Visit (HOSPITAL_BASED_OUTPATIENT_CLINIC_OR_DEPARTMENT_OTHER): Payer: 59 | Admitting: Family Medicine

## 2022-12-31 DIAGNOSIS — M47816 Spondylosis without myelopathy or radiculopathy, lumbar region: Secondary | ICD-10-CM | POA: Diagnosis not present

## 2023-01-11 DIAGNOSIS — F4320 Adjustment disorder, unspecified: Secondary | ICD-10-CM | POA: Diagnosis not present

## 2023-01-21 ENCOUNTER — Encounter (HOSPITAL_BASED_OUTPATIENT_CLINIC_OR_DEPARTMENT_OTHER): Payer: Self-pay | Admitting: Family Medicine

## 2023-01-22 DIAGNOSIS — R29898 Other symptoms and signs involving the musculoskeletal system: Secondary | ICD-10-CM | POA: Diagnosis not present

## 2023-01-22 DIAGNOSIS — M722 Plantar fascial fibromatosis: Secondary | ICD-10-CM | POA: Diagnosis not present

## 2023-01-22 DIAGNOSIS — M84372A Stress fracture, left ankle, initial encounter for fracture: Secondary | ICD-10-CM | POA: Diagnosis not present

## 2023-01-22 DIAGNOSIS — M79672 Pain in left foot: Secondary | ICD-10-CM | POA: Diagnosis not present

## 2023-01-25 DIAGNOSIS — F4320 Adjustment disorder, unspecified: Secondary | ICD-10-CM | POA: Diagnosis not present

## 2023-02-08 DIAGNOSIS — F4320 Adjustment disorder, unspecified: Secondary | ICD-10-CM | POA: Diagnosis not present

## 2023-02-18 DIAGNOSIS — M84372A Stress fracture, left ankle, initial encounter for fracture: Secondary | ICD-10-CM | POA: Diagnosis not present

## 2023-02-18 DIAGNOSIS — F4320 Adjustment disorder, unspecified: Secondary | ICD-10-CM | POA: Diagnosis not present

## 2023-02-18 DIAGNOSIS — M25572 Pain in left ankle and joints of left foot: Secondary | ICD-10-CM | POA: Diagnosis not present

## 2023-02-18 DIAGNOSIS — M722 Plantar fascial fibromatosis: Secondary | ICD-10-CM | POA: Diagnosis not present

## 2023-02-18 DIAGNOSIS — R29898 Other symptoms and signs involving the musculoskeletal system: Secondary | ICD-10-CM | POA: Diagnosis not present

## 2023-02-22 DIAGNOSIS — F4320 Adjustment disorder, unspecified: Secondary | ICD-10-CM | POA: Diagnosis not present

## 2023-03-02 ENCOUNTER — Ambulatory Visit (INDEPENDENT_AMBULATORY_CARE_PROVIDER_SITE_OTHER): Payer: 59 | Admitting: Family Medicine

## 2023-03-02 ENCOUNTER — Encounter (HOSPITAL_BASED_OUTPATIENT_CLINIC_OR_DEPARTMENT_OTHER): Payer: Self-pay | Admitting: Family Medicine

## 2023-03-02 VITALS — BP 119/98 | HR 85 | Ht 64.0 in | Wt 180.0 lb

## 2023-03-02 DIAGNOSIS — Z719 Counseling, unspecified: Secondary | ICD-10-CM | POA: Diagnosis not present

## 2023-03-02 DIAGNOSIS — Z6830 Body mass index (BMI) 30.0-30.9, adult: Secondary | ICD-10-CM

## 2023-03-02 MED ORDER — SEMAGLUTIDE-WEIGHT MANAGEMENT 0.25 MG/0.5ML ~~LOC~~ SOAJ: 0.2500 mg | SUBCUTANEOUS | 1 refills | Status: DC

## 2023-03-02 NOTE — Progress Notes (Signed)
 Established Patient Office Visit  Subjective  Patient ID: Jade Raymond, female    DOB: 02-08-1991  Age: 32 y.o. MRN: [id number]  Chief Complaint  Patient presents with   Weight Loss   Joint Swelling    Heel brake from overuse, wants to know about bone density or supplements   Jade Raymond is a 32 year old female patient who present today for concerns of weight gain and questions about medication.  She is currently in a boot for left stress fracture of her heel, fasciitis and bone spur.  She is being followed by [facility] and reports these injuries occurred about 10 days prior to Thanksgiving.  She is currently in a boot and is going to get an MRI to see how long she needs to wear a hard cast.  She is very active-she walks thousands of steps daily and has been banned from treadmill use.  She has been trying different cardio workouts such as rowing and biking.  She is stressed because this is a form of release for her anxiety.  She has concerns today about Celebrex.   Since she is unable to work out with her normal routine, she is concerned about gaining weight.  She reports she lost 60-80lbs in the past by working out.  And she is concerned about gaining this weight back as she will not be able to be active.  ROS: see HPI    Objective:    BP (!) 119/98   Pulse 85   Ht 5' 4 (1.626 m)   Wt 180 lb (81.6 kg)   SpO2 100%   BMI 30.90 kg/m  BP Readings from Last 3 Encounters:  03/02/23 (!) 119/98  12/09/22 (!) 138/94  12/26/21 (!) 125/92    Physical Exam Vitals reviewed.  Constitutional:      Appearance: Normal appearance.  Cardiovascular:     Rate and Rhythm: Normal rate and regular rhythm.     Pulses: Normal pulses.     Heart sounds: Normal heart sounds.  Pulmonary:     Effort: Pulmonary effort is normal.     Breath sounds: Normal breath sounds.  Neurological:     Mental Status: She is alert.  Psychiatric:        Mood and Affect: Mood normal.        Behavior:  Behavior normal.     Assessment & Plan:   1. BMI 30.0-30.9,adult (Primary) Patient is a pleasant 32 year old female who presents today for discussion of weight loss medication.  Due to her recent circumstances and injury of her left foot, she is concerned about weight gain since she we will be unable to be physically active.  Discussed various weight loss medication options.  Phentermine is not a good option for this patient due to her anxiety and elevated diastolic blood pressure.  Patient is hesitant about starting medication, as she just wishes to be on medication during the timeframe she is unable to be physically active. Discussed GLP-1 medications. Reviewed potential side effects with this class of medication, discussed appropriate administration as well as gradual titration moving forward for more noticeable weight loss. Provided 4 week sample of Wegovy  for patient and sent in prescription to pharmacy. If she stars medication, recommend that she schedule follow-up in about 4 weeks after starting.   - Semaglutide -Weight Management 0.25 MG/0.5ML SOAJ; Inject 0.25 mg into the skin once a week.  Dispense: 2 mL; Refill: 1  2. Health education Lengthy discussion with patient regarding weight loss medication and  Celebrex, which was recently prescribed by Ortho.  She is anxious about taking medication and is concerned about previous side effects.  Provided reassurance to patient regarding safety of Celebrex with other medication.  Discussed safety Wegovy  and side effects to be aware of.  Answered all questions at this time.  Patient verbalized an understanding and will reach out if she has questions.   Return in about 4 weeks (around 03/30/2023) for weight loss medication.   Spent 46 minutes on this patient encounter, including preparation, chart review, face-to-face counseling with patient and coordination of care, and documentation of encounter.    Evalene Arts, FNP

## 2023-03-06 DIAGNOSIS — M9905 Segmental and somatic dysfunction of pelvic region: Secondary | ICD-10-CM | POA: Diagnosis not present

## 2023-03-06 DIAGNOSIS — M7662 Achilles tendinitis, left leg: Secondary | ICD-10-CM | POA: Diagnosis not present

## 2023-03-06 DIAGNOSIS — M9903 Segmental and somatic dysfunction of lumbar region: Secondary | ICD-10-CM | POA: Diagnosis not present

## 2023-03-06 DIAGNOSIS — M9906 Segmental and somatic dysfunction of lower extremity: Secondary | ICD-10-CM | POA: Diagnosis not present

## 2023-03-08 DIAGNOSIS — F4320 Adjustment disorder, unspecified: Secondary | ICD-10-CM | POA: Diagnosis not present

## 2023-03-10 ENCOUNTER — Encounter (HOSPITAL_BASED_OUTPATIENT_CLINIC_OR_DEPARTMENT_OTHER): Payer: Self-pay | Admitting: Family Medicine

## 2023-03-10 DIAGNOSIS — M9905 Segmental and somatic dysfunction of pelvic region: Secondary | ICD-10-CM | POA: Diagnosis not present

## 2023-03-10 DIAGNOSIS — M9903 Segmental and somatic dysfunction of lumbar region: Secondary | ICD-10-CM | POA: Diagnosis not present

## 2023-03-10 DIAGNOSIS — M9906 Segmental and somatic dysfunction of lower extremity: Secondary | ICD-10-CM | POA: Diagnosis not present

## 2023-03-10 DIAGNOSIS — M7662 Achilles tendinitis, left leg: Secondary | ICD-10-CM | POA: Diagnosis not present

## 2023-03-17 DIAGNOSIS — M25572 Pain in left ankle and joints of left foot: Secondary | ICD-10-CM | POA: Diagnosis not present

## 2023-03-22 DIAGNOSIS — F4320 Adjustment disorder, unspecified: Secondary | ICD-10-CM | POA: Diagnosis not present

## 2023-04-02 DIAGNOSIS — M84372A Stress fracture, left ankle, initial encounter for fracture: Secondary | ICD-10-CM | POA: Diagnosis not present

## 2023-04-02 DIAGNOSIS — M722 Plantar fascial fibromatosis: Secondary | ICD-10-CM | POA: Diagnosis not present

## 2023-04-04 ENCOUNTER — Other Ambulatory Visit: Payer: Self-pay | Admitting: Medical Genetics

## 2023-04-12 DIAGNOSIS — F4320 Adjustment disorder, unspecified: Secondary | ICD-10-CM | POA: Diagnosis not present

## 2023-04-16 ENCOUNTER — Other Ambulatory Visit (HOSPITAL_COMMUNITY)
Admission: RE | Admit: 2023-04-16 | Discharge: 2023-04-16 | Disposition: A | Discharge status: Home / Self Care | Payer: Self-pay | Source: Ambulatory Visit | Attending: Medical Genetics | Admitting: Medical Genetics

## 2023-04-16 DIAGNOSIS — M722 Plantar fascial fibromatosis: Secondary | ICD-10-CM | POA: Diagnosis not present

## 2023-04-16 DIAGNOSIS — R29898 Other symptoms and signs involving the musculoskeletal system: Secondary | ICD-10-CM | POA: Diagnosis not present

## 2023-04-25 DIAGNOSIS — G894 Chronic pain syndrome: Secondary | ICD-10-CM | POA: Diagnosis not present

## 2023-04-25 DIAGNOSIS — M722 Plantar fascial fibromatosis: Secondary | ICD-10-CM | POA: Diagnosis not present

## 2023-04-26 DIAGNOSIS — F4320 Adjustment disorder, unspecified: Secondary | ICD-10-CM | POA: Diagnosis not present

## 2023-04-28 LAB — GENECONNECT MOLECULAR SCREEN: Genetic Analysis Overall Interpretation: NEGATIVE

## 2023-05-10 DIAGNOSIS — F4320 Adjustment disorder, unspecified: Secondary | ICD-10-CM | POA: Diagnosis not present

## 2023-05-11 DIAGNOSIS — M79672 Pain in left foot: Secondary | ICD-10-CM | POA: Diagnosis not present

## 2023-05-18 DIAGNOSIS — M79672 Pain in left foot: Secondary | ICD-10-CM | POA: Diagnosis not present

## 2023-05-24 DIAGNOSIS — F4320 Adjustment disorder, unspecified: Secondary | ICD-10-CM | POA: Diagnosis not present

## 2023-05-25 DIAGNOSIS — M79672 Pain in left foot: Secondary | ICD-10-CM | POA: Diagnosis not present

## 2023-06-01 DIAGNOSIS — M79672 Pain in left foot: Secondary | ICD-10-CM | POA: Diagnosis not present

## 2023-06-15 DIAGNOSIS — M722 Plantar fascial fibromatosis: Secondary | ICD-10-CM | POA: Diagnosis not present

## 2023-08-02 DIAGNOSIS — F411 Generalized anxiety disorder: Secondary | ICD-10-CM | POA: Diagnosis not present

## 2023-08-16 DIAGNOSIS — F411 Generalized anxiety disorder: Secondary | ICD-10-CM | POA: Diagnosis not present

## 2023-09-13 DIAGNOSIS — F411 Generalized anxiety disorder: Secondary | ICD-10-CM | POA: Diagnosis not present

## 2023-09-27 DIAGNOSIS — F411 Generalized anxiety disorder: Secondary | ICD-10-CM | POA: Diagnosis not present

## 2023-10-11 DIAGNOSIS — F411 Generalized anxiety disorder: Secondary | ICD-10-CM | POA: Diagnosis not present

## 2023-10-14 DIAGNOSIS — F605 Obsessive-compulsive personality disorder: Secondary | ICD-10-CM | POA: Diagnosis not present

## 2023-10-14 DIAGNOSIS — F419 Anxiety disorder, unspecified: Secondary | ICD-10-CM | POA: Diagnosis not present

## 2023-10-14 DIAGNOSIS — Z79899 Other long term (current) drug therapy: Secondary | ICD-10-CM | POA: Diagnosis not present

## 2023-10-14 DIAGNOSIS — Z5181 Encounter for therapeutic drug level monitoring: Secondary | ICD-10-CM | POA: Diagnosis not present

## 2023-10-25 DIAGNOSIS — F411 Generalized anxiety disorder: Secondary | ICD-10-CM | POA: Diagnosis not present

## 2023-11-08 DIAGNOSIS — F419 Anxiety disorder, unspecified: Secondary | ICD-10-CM | POA: Diagnosis not present

## 2023-11-08 DIAGNOSIS — F605 Obsessive-compulsive personality disorder: Secondary | ICD-10-CM | POA: Diagnosis not present

## 2023-11-15 DIAGNOSIS — F411 Generalized anxiety disorder: Secondary | ICD-10-CM | POA: Diagnosis not present

## 2023-12-27 DIAGNOSIS — F411 Generalized anxiety disorder: Secondary | ICD-10-CM | POA: Diagnosis not present

## 2024-01-07 DIAGNOSIS — G8929 Other chronic pain: Secondary | ICD-10-CM | POA: Diagnosis not present

## 2024-01-07 DIAGNOSIS — M25511 Pain in right shoulder: Secondary | ICD-10-CM | POA: Diagnosis not present

## 2024-01-18 DIAGNOSIS — M25511 Pain in right shoulder: Secondary | ICD-10-CM | POA: Diagnosis not present

## 2024-01-21 DIAGNOSIS — M25511 Pain in right shoulder: Secondary | ICD-10-CM | POA: Diagnosis not present

## 2024-01-24 DIAGNOSIS — F411 Generalized anxiety disorder: Secondary | ICD-10-CM | POA: Diagnosis not present

## 2024-01-31 DIAGNOSIS — M25511 Pain in right shoulder: Secondary | ICD-10-CM | POA: Diagnosis not present

## 2024-02-07 DIAGNOSIS — M25511 Pain in right shoulder: Secondary | ICD-10-CM | POA: Diagnosis not present

## 2024-02-09 DIAGNOSIS — M25511 Pain in right shoulder: Secondary | ICD-10-CM | POA: Diagnosis not present

## 2024-02-09 DIAGNOSIS — M7541 Impingement syndrome of right shoulder: Secondary | ICD-10-CM | POA: Diagnosis not present

## 2024-02-10 DIAGNOSIS — F411 Generalized anxiety disorder: Secondary | ICD-10-CM | POA: Diagnosis not present

## 2024-02-11 ENCOUNTER — Telehealth (HOSPITAL_BASED_OUTPATIENT_CLINIC_OR_DEPARTMENT_OTHER): Payer: Self-pay | Admitting: Family Medicine

## 2024-02-11 NOTE — Telephone Encounter (Signed)
 Copied from CRM #[id number]. Topic: Clinical - Request for Lab/Test Order >> Feb 11, 2024  9:10 AM Jade Raymond wrote: Reason for CRM: Patient is requesting a general blood panel to be order , before she has surgery on  02/22/2024 for a full rotator cuff repair to have ready fro her January visit.   Patient has not transferred to [facility] yet. Visit is 03/17/24

## 2024-02-14 NOTE — Telephone Encounter (Signed)
 Routing message from pt to [facility] for review. Pt is not a current pt of the office yet.  Please advise what you recommend to happen since pt is not a pt yet and requesting labs.

## 2024-02-21 DIAGNOSIS — F411 Generalized anxiety disorder: Secondary | ICD-10-CM | POA: Diagnosis not present

## 2024-02-22 DIAGNOSIS — M7541 Impingement syndrome of right shoulder: Secondary | ICD-10-CM | POA: Diagnosis not present

## 2024-02-22 DIAGNOSIS — M25811 Other specified joint disorders, right shoulder: Secondary | ICD-10-CM | POA: Diagnosis not present

## 2024-02-22 DIAGNOSIS — Y999 Unspecified external cause status: Secondary | ICD-10-CM | POA: Diagnosis not present

## 2024-02-22 DIAGNOSIS — M75101 Unspecified rotator cuff tear or rupture of right shoulder, not specified as traumatic: Secondary | ICD-10-CM | POA: Diagnosis not present

## 2024-02-22 DIAGNOSIS — X58XXXA Exposure to other specified factors, initial encounter: Secondary | ICD-10-CM | POA: Diagnosis not present

## 2024-02-22 DIAGNOSIS — S43491A Other sprain of right shoulder joint, initial encounter: Secondary | ICD-10-CM | POA: Diagnosis not present

## 2024-02-22 DIAGNOSIS — M19011 Primary osteoarthritis, right shoulder: Secondary | ICD-10-CM | POA: Diagnosis not present

## 2024-03-17 ENCOUNTER — Encounter (HOSPITAL_BASED_OUTPATIENT_CLINIC_OR_DEPARTMENT_OTHER): Admitting: Family Medicine

## 2024-03-20 ENCOUNTER — Encounter (HOSPITAL_BASED_OUTPATIENT_CLINIC_OR_DEPARTMENT_OTHER): Payer: Self-pay

## 2024-03-20 ENCOUNTER — Ambulatory Visit (HOSPITAL_BASED_OUTPATIENT_CLINIC_OR_DEPARTMENT_OTHER)

## 2024-03-20 VITALS — BP 138/90 | HR 88 | Ht 64.0 in | Wt 190.8 lb

## 2024-03-20 DIAGNOSIS — Z0000 Encounter for general adult medical examination without abnormal findings: Secondary | ICD-10-CM | POA: Diagnosis not present

## 2024-03-20 NOTE — Progress Notes (Signed)
 "  Subjective:   Jade Raymond [birth year]  03/20/2024   CC: Chief Complaint  Patient presents with   Transitions Of Care    Patient is here to get established with PCP. States she would like blood work done.    HPI: Jade Raymond is a 34 y.o. female who presents for a routine health maintenance exam.  Pt is transitioning care from previous PCP who left the office. Requesting physical today and was told lab work would not be done today so she ate approximately 1 hour ago. Pt set on getting physical done today and coming back for labs first thing Friday morning.    HEALTH SCREENINGS: - Vision Screening: up to date - Dental Visits: up to date - Pap smear: up to date - Breast Exam: up to date - STD Screening: Not applicable - Mammogram (40+): Not applicable  - Colonoscopy (45+): Not applicable  - Bone Density (65+ or under 65 with predisposing conditions): Not applicable  - Lung CA screening with low-dose CT:  Not applicable Adults age 96-80 who are current cigarette smokers or quit within the last 15 years. Must have 20 pack year history.   Depression and Anxiety Screen done today and results listed below:     03/20/2024    2:53 PM 12/26/2021    8:40 AM 08/20/2021   11:00 AM 12/20/2020    8:43 AM 04/17/2015    9:58 AM  Depression screen PHQ 2/9  Decreased Interest 0 0 0 0 0  Down, Depressed, Hopeless 0 0 0 0 0  PHQ - 2 Score 0 0 0 0 0  Altered sleeping 3   3   Tired, decreased energy 1   0   Change in appetite 0   0   Feeling bad or failure about yourself  0   0   Trouble concentrating 0   0   Moving slowly or fidgety/restless 0   0   Suicidal thoughts 0   0   PHQ-9 Score 4   3    Difficult doing work/chores Somewhat difficult   Not difficult at all      Data saved with a previous flowsheet row definition      03/20/2024    2:54 PM 12/20/2020    8:43 AM  GAD 7 : Generalized Anxiety Score  Nervous, Anxious, on Edge 0  0   Control/stop worrying 1  0   Worry too  much - different things 1  0   Trouble relaxing 1  0   Restless 0  0   Easily annoyed or irritable 1  0   Afraid - awful might happen 0  0   Total GAD 7 Score 4 0  Anxiety Difficulty Not difficult at all      Data saved with a previous flowsheet row definition    IMMUNIZATIONS: - Tdap: Tetanus vaccination status reviewed: tetanus status unknown to the patient. - HPV: Up to date - Influenza: Up to date - Pneumovax: Not applicable - Prevnar 20: Not applicable - Shingrix (50+): Not applicable   Past medical history, surgical history, medications, allergies, family history and social history reviewed with patient today and changes made to appropriate areas of the chart.   Past Medical History:  Diagnosis Date   Allergy    Seasonal   Costochondritis     Past Surgical History:  Procedure Laterality Date   APPENDECTOMY  2007   CARPAL TUNNEL RELEASE Bilateral    CHOLECYSTECTOMY  2009   KNEE SURGERY Left    ROTATOR CUFF REPAIR Right    WISDOM TOOTH EXTRACTION      Current Outpatient Medications on File Prior to Visit  Medication Sig   cyclobenzaprine (FLEXERIL) 10 MG tablet Take 10 mg by mouth 3 (three) times daily as needed for muscle spasms.   HYDROcodone-acetaminophen (NORCO/VICODIN) 5-325 MG tablet Take 1 tablet by mouth every 6 (six) hours as needed.   levonorgestrel-ethinyl estradiol  (NORDETTE) 0.15-30 MG-MCG tablet Take 1 tablet by mouth daily.   PERCOCET 5-325 MG tablet Take 1 tablet by mouth every 6 (six) hours as needed.   No current facility-administered medications on file prior to visit.    Allergies[1]   Social History   Socioeconomic History   Marital status: Single    Spouse name: Not on file   Number of children: Not on file   Years of education: Not on file   Highest education level: Bachelor's degree (e.g., BA, AB, BS)  Occupational History    Comment: Paralegal  Tobacco Use   Smoking status: Never   Smokeless tobacco: Never  Vaping Use    Vaping status: Never Used  Substance and Sexual Activity   Alcohol use: Yes    Alcohol/week: 2.0 standard drinks of alcohol    Types: 2 Cans of beer per week   Drug use: No   Sexual activity: Yes  Other Topics Concern   Not on file  Social History Narrative   ** Merged History Encounter **       Social Drivers of Health   Tobacco Use: Low Risk (03/20/2024)   Patient History    Smoking Tobacco Use: Never    Smokeless Tobacco Use: Never    Passive Exposure: Not on file  Financial Resource Strain: Low Risk (03/14/2024)   Overall Financial Resource Strain (CARDIA)    Difficulty of Paying Living Expenses: Not hard at all  Food Insecurity: No Food Insecurity (03/14/2024)   Epic    Worried About Programme Researcher, Broadcasting/film/video in the Last Year: Never true    Ran Out of Food in the Last Year: Never true  Transportation Needs: No Transportation Needs (03/14/2024)   Epic    Lack of Transportation (Medical): No    Lack of Transportation (Non-Medical): No  Physical Activity: Sufficiently Active (03/14/2024)   Exercise Vital Sign    Days of Exercise per Week: 7 days    Minutes of Exercise per Session: 60 min  Stress: Stress Concern Present (03/14/2024)   [employer] of Occupational Health - Occupational Stress Questionnaire    Feeling of Stress: Rather much  Social Connections: Moderately Isolated (03/14/2024)   Social Connection and Isolation Panel    Frequency of Communication with Friends and Family: More than three times a week    Frequency of Social Gatherings with Friends and Family: More than three times a week    Attends Religious Services: 1 to 4 times per year    Active Member of [employer] or Organizations: No    Attends Banker Meetings: Not on file    Marital Status: Never married  Intimate Partner Violence: Not At Risk (03/20/2024)   Epic    Fear of Current or Ex-Partner: No    Emotionally Abused: No    Physically Abused: No    Sexually Abused: No  Depression (PHQ2-9):  Low Risk (03/20/2024)   Depression (PHQ2-9)    PHQ-2 Score: 4  Alcohol Screen: Low Risk (03/14/2024)   Alcohol Screen  Last Alcohol Screening Score (AUDIT): 3  Housing: Low Risk (03/14/2024)   Epic    Unable to Pay for Housing in the Last Year: No    Number of Times Moved in the Last Year: 0    Homeless in the Last Year: No  Utilities: Not At Risk (03/20/2024)   Epic    Threatened with loss of utilities: No  Health Literacy: Not on file   Tobacco Use History[2] Social History   Substance and Sexual Activity  Alcohol Use Yes   Alcohol/week: 2.0 standard drinks of alcohol   Types: 2 Cans of beer per week    Family History  Problem Relation Age of Onset   Asthma Mother    Cancer Maternal Uncle    Stroke Maternal Grandmother    Cancer Maternal Grandmother    Heart attack Maternal Grandfather    Deep vein thrombosis Maternal Grandfather    Thyroid  disease Maternal Grandfather    Cancer Maternal Grandfather    Heart disease Maternal Grandfather    Kidney disease Maternal Grandfather    Deep vein thrombosis Paternal Grandmother    Diabetes Paternal Grandmother    Heart attack Paternal Grandfather      ROS: Denies fever, fatigue, unexplained weight loss/gain, chest pain, SHOB, and palpitations. Denies neurological deficits, gastrointestinal or genitourinary complaints, and skin changes.   Objective:   Today's Vitals   03/20/24 1431 03/20/24 1516  BP: (!) 147/87 (!) 138/90  Pulse: 88   SpO2: 94%   Weight: 190 lb 12.8 oz (86.5 kg)   Height: 5' 4 (1.626 m)     GENERAL APPEARANCE: Well-appearing, in NAD. Well nourished. SKIN: Pink, warm and dry. Turgor normal. No rash, lesion, ulceration, or ecchymoses. Hair evenly distributed.  HEENT: HEAD: Normocephalic.  EYES: PERRLA. EOMI. Lids intact w/o defect. Sclera white, Conjunctiva pink w/o exudate.  EARS: External ear w/o redness, swelling, masses or lesions. EAC clear. TM's intact, translucent w/o bulging, appropriate  landmarks visualized. Appropriate acuity to conversational tones.  NOSE: Septum midline w/o deformity. Nares patent, mucosa pink and non-inflamed w/o drainage. No sinus tenderness.  THROAT: Uvula midline. Oropharynx clear. Tonsils non-inflamed w/o exudate. Oral mucosa pink and moist.  NECK: Supple, Trachea midline. Full ROM w/o pain or tenderness. No lymphadenopathy. Thyroid  non-tender w/o enlargement or palpable masses. BREASTS: Deferred. RESPIRATORY: Chest wall symmetrical w/o masses. Respirations even and non-labored. Breath sounds clear to auscultation bilaterally. No wheezes, rales, rhonchi, or crackles. CARDIAC: S1, S2 present, regular rate and rhythm. No gallops, murmurs, rubs, or clicks. PMI w/o lifts, heaves, or thrills. No carotid bruits. Capillary refill <2 seconds. Peripheral pulses 2+ bilaterally. GI: Abdomen soft w/o distention. Normoactive bowel sounds. No palpable masses or tenderness. No guarding or rebound tenderness. Liver and spleen w/o tenderness or enlargement. No CVA tenderness.  GU: Deferred. MSK: Muscle tone and strength appropriate for age, w/o atrophy or abnormal movement.  EXTREMITIES: Active ROM intact, w/o tenderness, crepitus, or contracture. No obvious joint deformities or effusions. No clubbing, edema, or cyanosis. RUE in sling from rotator cuff surgery. Decreased ROM. NEUROLOGIC: CN's II-XII intact. Motor strength symmetrical with no obvious weakness. No sensory deficits. DTR's 2+ symmetric bilaterally. Steady, even gait.  PSYCH/MENTAL STATUS: Alert, oriented x 3. Cooperative, appropriate mood and affect.    Results for orders placed or performed during the hospital encounter of 04/16/23  [facility] Molecular Screen - Blood [facility])   Collection Time: 04/16/23 10:55 AM  Result Value Ref Range   Genetic Analysis Overall Interpretation Negative  Genetic Disease Assessed      [employer] [facility] Population Screen is a screening test that analyzes 11  genes related to hereditary breast and ovarian cancer (HBOC) syndrome, Lynch syndrome, and familial hypercholesterolemia. This test only reports clinically significant pathogenic and  likely pathogenic variants but does not report variants of uncertain significance (VUS). In addition, analysis of the PMS2 gene excludes exons 11-15, which overlap with a known pseudogene (PMS2CL).    Genetic Analysis Report      No pathogenic or likely pathogenic variants were detected in the genes analyzed by this test.Genetic test results should be interpreted in the context of an individual's personal medical and family history. Alteration to medical management is NOT  recommended based solely on this result. Clinical correlation is advised.Additional Considerations- This is a screening test; individuals may still carry pathogenic or likely pathogenic variant(s) in the tested genes that are not detected by this test.-  For individuals at risk for these or other related conditions based on factors including personal or family history, diagnostic testing is recommended.- The absence of pathogenic or likely pathogenic variant(s) in the analyzed genes, while reassuring,  does not eliminate the possibility of a hereditary condition; there are other variants and genes associated with heart disease and hereditary cancer that are not included in this test.    Genes Tested See Notes    Disclaimer See Notes    Sequencing Location See Notes    Interpretation Methods and Limitations See Notes     Assessment & Plan:  1. Annual physical exam (Primary) Discussed preventative screenings, vaccines, lab work, and healthy lifestyle with patient.  Discussed close follow up after labs result. Pt verbalized understanding and states she will come Friday for labs. - Comprehensive metabolic panel with GFR - CBC with Differential/Platelet - Hemoglobin A1c - TSH - Lipid panel   PATIENT COUNSELING:  - Encouraged a healthy  well-balanced diet. Patient may adjust caloric intake to maintain or achieve ideal body weight. May reduce intake of dietary saturated fat and total fat and have adequate dietary potassium and calcium preferably from fresh fruits, vegetables, and low-fat dairy products.   - Advised to avoid cigarette smoking. - Discussed with the patient that most people either abstain from alcohol or drink within safe limits (<=14/week and <=4 drinks/occasion for males, <=7/weeks and <= 3 drinks/occasion for females) and that the risk for alcohol disorders and other health effects rises proportionally with the number of drinks per week and how often a drinker exceeds daily limits. - Discussed cessation/primary prevention of drug use and availability of treatment for abuse.  - Discussed sexually transmitted diseases, avoidance of unintended pregnancy and contraceptive alternatives.  - Stressed the importance of regular exercise - Injury prevention: Discussed safety belts, safety helmets, smoke detector, smoking near bedding or upholstery.  - Dental health: Discussed importance of regular tooth brushing, flossing, and dental visits.   NEXT PREVENTATIVE PHYSICAL DUE IN 1 YEAR.  Return in about 1 year (around 03/20/2025) for annual physical.  Patient to reach out to office if new, worrisome, or unresolved symptoms arise or if no improvement in patient's condition. Patient verbalized understanding and is agreeable to treatment plan. All questions answered to patient's satisfaction.    Lauraine Almarie Angus DNP, FNP-C     [1]  Allergies Allergen Reactions   Chlorhexidine Hives, Itching and Rash    Other reaction(s): Unknown  [2]  Social History Tobacco Use  Smoking Status Never  Smokeless Tobacco Never   "

## 2024-03-24 LAB — CBC WITH DIFFERENTIAL/PLATELET
Basophils Absolute: 0 x10E3/uL (ref 0.0–0.2)
Basos: 1 %
EOS (ABSOLUTE): 0.1 x10E3/uL (ref 0.0–0.4)
Eos: 1 %
Hematocrit: 44.4 % (ref 34.0–46.6)
Hemoglobin: 14.6 g/dL (ref 11.1–15.9)
Immature Grans (Abs): 0 x10E3/uL (ref 0.0–0.1)
Immature Granulocytes: 0 %
Lymphocytes Absolute: 1.8 x10E3/uL (ref 0.7–3.1)
Lymphs: 29 %
MCH: 30.8 pg (ref 26.6–33.0)
MCHC: 32.9 g/dL (ref 31.5–35.7)
MCV: 94 fL (ref 79–97)
Monocytes Absolute: 0.3 x10E3/uL (ref 0.1–0.9)
Monocytes: 5 %
Neutrophils Absolute: 4 x10E3/uL (ref 1.4–7.0)
Neutrophils: 64 %
Platelets: 244 x10E3/uL (ref 150–450)
RBC: 4.74 x10E6/uL (ref 3.77–5.28)
RDW: 11.9 % (ref 11.7–15.4)
WBC: 6.2 x10E3/uL (ref 3.4–10.8)

## 2024-03-24 LAB — COMPREHENSIVE METABOLIC PANEL WITH GFR
ALT: 19 IU/L (ref 0–32)
AST: 23 IU/L (ref 0–40)
Albumin: 4.4 g/dL (ref 3.9–4.9)
Alkaline Phosphatase: 77 IU/L (ref 41–116)
BUN/Creatinine Ratio: 21 (ref 9–23)
BUN: 16 mg/dL (ref 6–20)
Bilirubin Total: 0.4 mg/dL (ref 0.0–1.2)
CO2: 20 mmol/L (ref 20–29)
Calcium: 9.4 mg/dL (ref 8.7–10.2)
Chloride: 102 mmol/L (ref 96–106)
Creatinine, Ser: 0.77 mg/dL (ref 0.57–1.00)
Globulin, Total: 2.8 g/dL (ref 1.5–4.5)
Glucose: 91 mg/dL (ref 70–99)
Potassium: 4.4 mmol/L (ref 3.5–5.2)
Sodium: 138 mmol/L (ref 134–144)
Total Protein: 7.2 g/dL (ref 6.0–8.5)
eGFR: 103 mL/min/1.73

## 2024-03-24 LAB — HEMOGLOBIN A1C
Est. average glucose Bld gHb Est-mCnc: 103 mg/dL
Hgb A1c MFr Bld: 5.2 % (ref 4.8–5.6)

## 2024-03-24 LAB — LIPID PANEL
Chol/HDL Ratio: 4 ratio (ref 0.0–4.4)
Cholesterol, Total: 214 mg/dL — ABNORMAL HIGH (ref 100–199)
HDL: 54 mg/dL
LDL Chol Calc (NIH): 148 mg/dL — ABNORMAL HIGH (ref 0–99)
Triglycerides: 69 mg/dL (ref 0–149)
VLDL Cholesterol Cal: 12 mg/dL (ref 5–40)

## 2024-03-24 LAB — TSH: TSH: 0.923 u[IU]/mL (ref 0.450–4.500)

## 2024-03-27 ENCOUNTER — Ambulatory Visit (HOSPITAL_BASED_OUTPATIENT_CLINIC_OR_DEPARTMENT_OTHER): Payer: Self-pay

## 2024-03-27 DIAGNOSIS — Z13220 Encounter for screening for lipoid disorders: Secondary | ICD-10-CM

## 2024-03-27 NOTE — Progress Notes (Signed)
 Please schedule for lab visit in 4 months   Jade Raymond, Your lab work overall looks great! Your cholesterol levels were higher than a year ago. I would like for us  to keep an eye on that. I know with recent surgery you have had to adjust your day to day activities/routine. I would like for you to focus on increasing your whole foods and decreasing processed foods. I know exercise is limited, but lets try and get in 20 min of purposeful walking three times a week if possible. I will plan to recheck this lab in 4 months to see where you are.

## 2024-07-28 ENCOUNTER — Ambulatory Visit (HOSPITAL_BASED_OUTPATIENT_CLINIC_OR_DEPARTMENT_OTHER)
